# Patient Record
Sex: Male | Born: 1960 | Race: White | Hispanic: No | Smoking: Current every day smoker
Health system: Southern US, Community
[De-identification: ages and names within clinical notes are randomized; demographics above are authoritative.]

## PROBLEM LIST (undated history)

## (undated) DIAGNOSIS — J449 Chronic obstructive pulmonary disease, unspecified: Secondary | ICD-10-CM

## (undated) HISTORY — PX: HAND SURGERY: SHX662

---

## 2002-05-24 ENCOUNTER — Encounter: Payer: Self-pay | Admitting: Family Medicine

## 2002-05-24 ENCOUNTER — Ambulatory Visit (HOSPITAL_COMMUNITY): Admission: RE | Admit: 2002-05-24 | Discharge: 2002-05-24 | Payer: Self-pay | Admitting: Family Medicine

## 2004-05-16 ENCOUNTER — Ambulatory Visit (HOSPITAL_COMMUNITY): Admission: RE | Admit: 2004-05-16 | Discharge: 2004-05-16 | Payer: Self-pay | Admitting: Family Medicine

## 2006-09-24 ENCOUNTER — Inpatient Hospital Stay (HOSPITAL_COMMUNITY): Admission: EM | Admit: 2006-09-24 | Discharge: 2006-09-28 | Payer: Self-pay | Admitting: Emergency Medicine

## 2006-09-24 ENCOUNTER — Encounter: Payer: Self-pay | Admitting: Cardiology

## 2006-09-24 ENCOUNTER — Ambulatory Visit: Payer: Self-pay | Admitting: Cardiology

## 2006-10-11 ENCOUNTER — Ambulatory Visit: Payer: Self-pay | Admitting: Internal Medicine

## 2007-04-22 ENCOUNTER — Ambulatory Visit: Payer: Self-pay | Admitting: Internal Medicine

## 2008-05-28 IMAGING — CR DG CHEST 1V PORT
1 series · 1 of 1 positions shown · non-contrast
Comparison: None

CLINICAL DATA: Chest pain. Smoker. Weakness.

CHEST - 1 VIEW

[view not recorded]
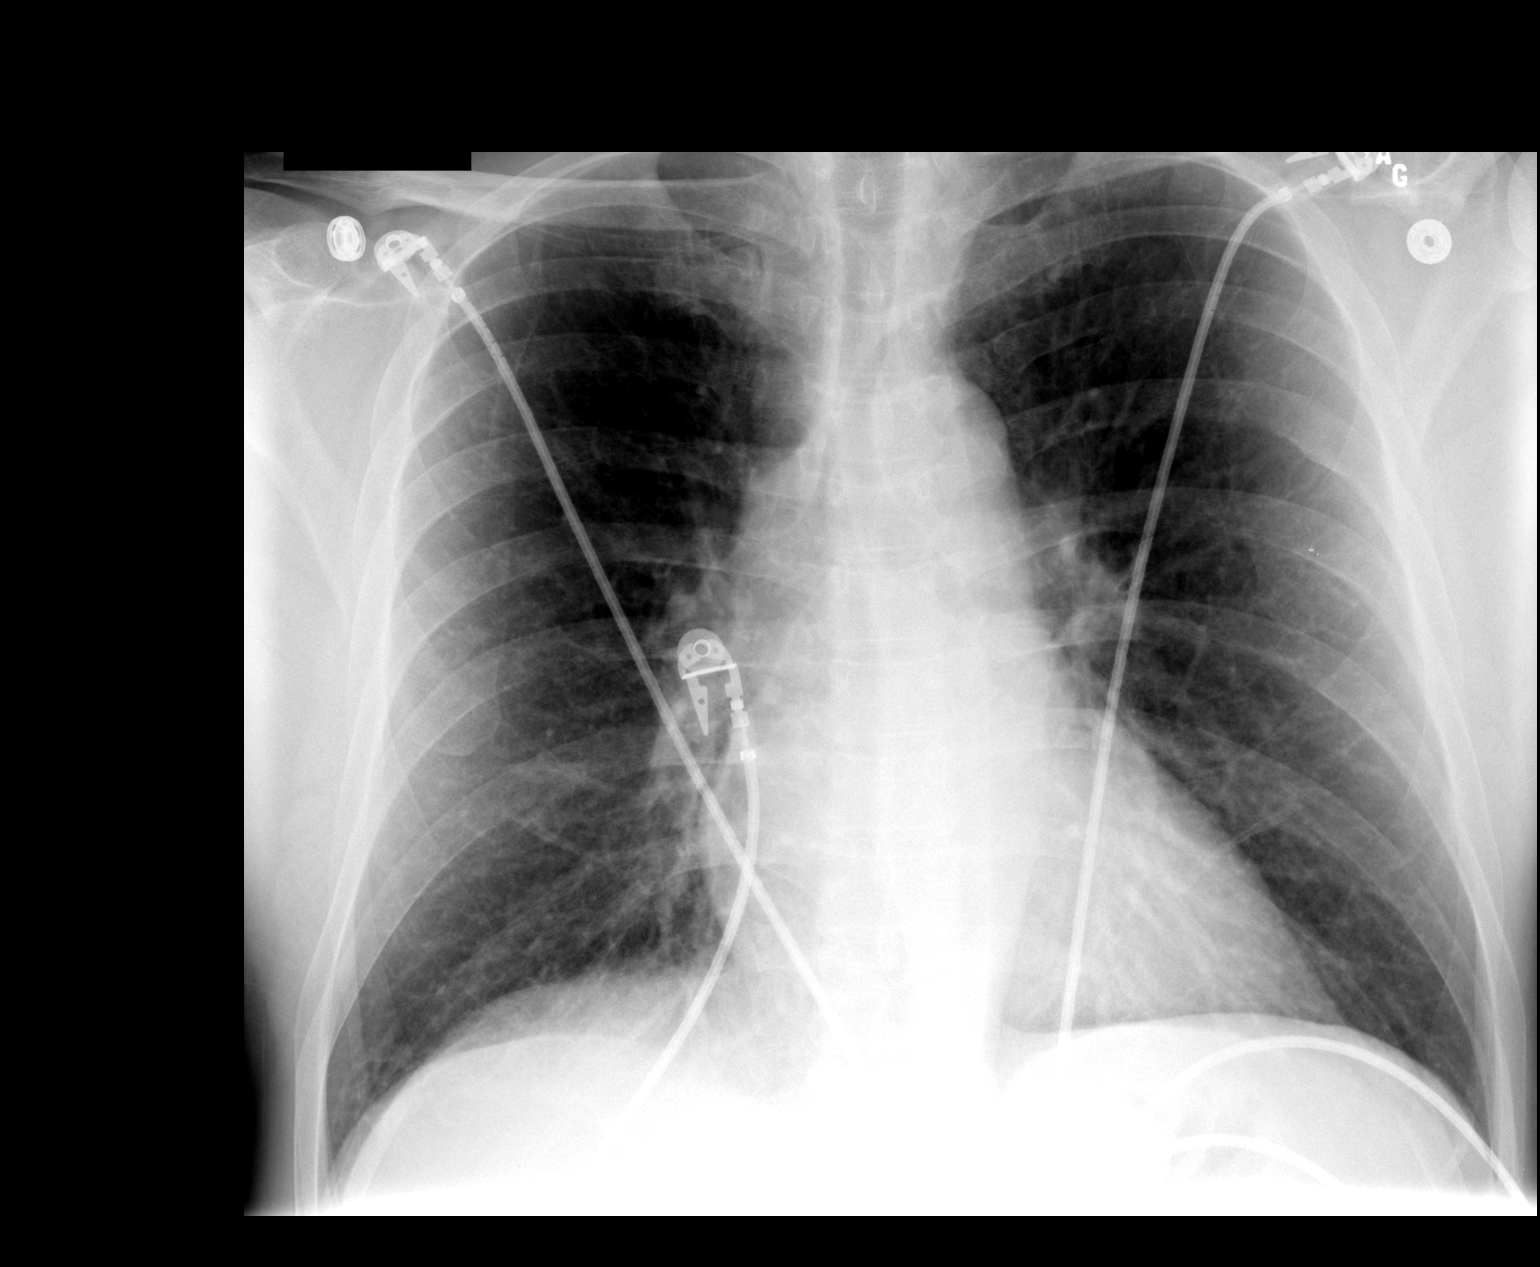

[1 of 1 positions shown; findings below may reference images not displayed]

FINDINGS: Midline trachea. Heart size upper limits of normal . Normal
mediastinum contours. Sharp costophrenic angles. No pneumothorax or congestive
failure. Mild peribronchial thickening. Clear lungs.

IMPRESSION

1. No acute cardiopulmonary disease.
2. Mild parabronchial thickening likely relates to the given history of smoking.

## 2009-01-05 ENCOUNTER — Emergency Department (HOSPITAL_COMMUNITY): Admission: EM | Admit: 2009-01-05 | Discharge: 2009-01-05 | Payer: Self-pay | Admitting: Emergency Medicine

## 2010-07-10 LAB — URINALYSIS, ROUTINE W REFLEX MICROSCOPIC
Glucose, UA: NEGATIVE mg/dL
Ketones, ur: 15 mg/dL — AB
Leukocytes, UA: NEGATIVE
Nitrite: NEGATIVE
Protein, ur: 100 mg/dL — AB
Specific Gravity, Urine: 1.025 (ref 1.005–1.030)
Urobilinogen, UA: 0.2 mg/dL (ref 0.0–1.0)
pH: 6 (ref 5.0–8.0)

## 2010-07-10 LAB — URINE MICROSCOPIC-ADD ON

## 2010-07-10 LAB — BASIC METABOLIC PANEL
Chloride: 90 mEq/L — ABNORMAL LOW (ref 96–112)
GFR calc Af Amer: >60 mL/min (ref >60)
Potassium: 3 mEq/L — ABNORMAL LOW (ref 3.5–5.1)

## 2010-07-10 LAB — CBC
HCT: 45.2 % (ref 39.0–52.0)
Hemoglobin: 15.9 g/dL (ref 13.0–17.0)
MCV: 89.2 fL (ref 78.0–100.0)
RBC: 5.07 MIL/uL (ref 4.22–5.81)
WBC: 15.2 10*3/uL — ABNORMAL HIGH (ref 4.0–10.5)

## 2010-07-10 LAB — CULTURE, BLOOD (ROUTINE X 2): Report Status: 10072010
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
## 2010-08-19 NOTE — Assessment & Plan Note (Signed)
[facility]                         ELECTROPHYSIOLOGY OFFICE NOTE   NAME:Shawn Fisher, Shawn Fisher                       MRN:          [id number]  DATE:10/11/2006                            DOB:          [birth year]    Shawn Fisher is referred today by Dr. Simona Huh for evaluation of tachy  palpitations and documented atrial tachycardia. The patient is  a very  pleasant middle-aged man who has a history of tachy palpitations and  documented SVT (atrial tach) resulting in admission to the hospital back  in June.  At that time, he was placed on beta blockers, underwent  catheterization demonstrating no obstructive coronary disease.  He did  have mild to moderate impressed LV function at 45%.  The patient states  that in the last several months, he has had frequent palpitations which  have worsened.  His lifestyle has been unusually difficult for him, in  that he has increased his tobacco use and increased his alcohol use,  also with decreased sleep.  He states that he has been under increasing  stress, as his granddaughter died suddenly at age 87, and he has been  working away from home and had difficulty sleeping.  Despite this, he  has had no syncope.  He denies peripheral edema.  He did have mild chest  discomfort.  After being hospitalized with severe chest pain and tachy  palpitations and SVT, he was treated with beta blockers resulting in  restoration of sinus rhythm.  Since discharge from the hospital, he has  been on beta blockers as well and has done with no recurrent tachy  palpitations and no documented SVT.  The patient states that his alcohol  consumption has decreased markedly, though he continues to smoke  approximately one pack of cigarettes per day.  He does have some  complaints of depression and is being evaluated by Dr. Gerda Diss for this.   MEDICATIONS:  Include aspirin, Toprol 100 mg daily, lisinopril 10 a day.  He had a prescription for Chantix  but has not had it filled.   FAMILY HISTORY:  Is noncontributory.   SOCIAL HISTORY:  As noted in the HPI.  Also has a history of marijuana  use.   REVIEW OF SYSTEMS:  Is as noted in the HPI, otherwise all systems were  reviewed and found to be negative.   PHYSICAL EXAMINATION:  GENERAL:  Pleasant, middle-aged man in no acute  distress.  VITAL SIGNS:  His blood pressure today was 148/90, the pulse 60 and  regular, the respirations were 18, the weight was 163 pounds.  HEENT:  Normocephalic, atraumatic.  Pupils equal and round.  Oropharynx  is moist, sclerae anicteric.  NECK:  Revealed no jugular venous distention.  There was no thyromegaly.  Trachea was midline.  The carotids were 2+ and symmetric.  LUNGS:  Clear bilaterally to auscultation.  No wheezes, rales or rhonchi  are present.  There is no increased work of breathing.  CARDIOVASCULAR:  Revealed a regular rate and rhythm and normal S1, S2.  There are no murmurs, rubs or gallops appreciated.  The PMI  was not enlarged nor was laterally displaced.  ABDOMEN:  Soft, nontender, nondistended.  There was no organomegaly.  The bowel sounds were present.  There is no rebound or guarding.  EXTREMITIES:  Demonstrated no cyanosis, clubbing or edema.  The pulses  are 2+ and symmetric.  NEUROLOGIC:  Alert and oriented x3.  Cranial nerves intact.  The  strength was 5/5 and symmetric.   The EKG demonstrates sinus rhythm with normal axis and intervals.  There  was an occasional PAC noted.   IMPRESSION:  1. Supraventricular tachycardia, likely secondary to atrial      tachycardia.  2. Mild to moderate LV dysfunction, likely secondary to ETOH abuse.  3. History of polysubstance abuse.   DISCUSSION:  I have recommended that Shawn Fisher stop abusing alcohol and  tobacco and recreational drugs.  I have recommended a period watchful  radium with regard to his arrhythmias.  If he has recurrent SVT, then I  would suggest that he considering  undergoing ablation.  Will plan to see  him back in the office in several months.     Doylene Canning. Ladona Ridgel, MD  Electronically Signed    GWT/MedQ  DD: 10/11/2006  DT: 10/11/2006  Job #: [id number]

## 2010-08-19 NOTE — Assessment & Plan Note (Signed)
[facility]                         ELECTROPHYSIOLOGY OFFICE NOTE   NAME:Shawn Fisher                       MRN:          [id number]  DATE:04/22/2007                            DOB:          [birth year]    Shawn Fisher returns today for follow-up.  He is a very pleasant 50-year-  old man who has a history of atrial tachycardia which was exacerbated by  some substance abuse including abuse of alcohol for which he was  hospitalized for back in the summertime.  Since then he has been stable.  He has quit drinking and he is no longer using illicit drugs.  He  returns today for follow-up. Since I saw him back in July, he has had  very little if any palpitations and denies shortness of breath.  He has  had no chest pain.   MEDICATIONS:  Toprol XL 100 a day which he has tolerating very nicely.   PHYSICAL EXAM:  Notable for him being a pleasant well-appearing young  man in no distress.  Blood pressure was 170/90, the pulse was 54 and regular, respirations  were 18.  Weight was 170 pounds.  NECK:  Revealed no jugular venous distention.  LUNGS:  Clear bilaterally to auscultation.  No wheezes, rales, or  rhonchi were present.  CARDIOVASCULAR:  Regular rate and rhythm with normal S1 and S2.  There  is a soft S4 gallop present.  EXTREMITY:  Demonstrated no edema.   IMPRESSION:  1. Symptomatic SVT now quiet.  2. Hypertension.   DISCUSSION:  Overall Shawn Fisher is stable, except that his blood  pressure remains somewhat elevated.  He continues on Toprol and he notes  that he did not take it this morning and I have asked that he go home  and take it. He may ultimately require additional antihypertensive  medicines and I have referred back to Dr. Gerda Diss for this who is his  primary physician in [place]. I will plan to see the patient back in  the office on a p.r.n. basis.     Doylene Canning. Ladona Ridgel, MD  Electronically Signed    GWT/MedQ  DD: 04/22/2007  DT:  04/22/2007  Job #: [id number]   cc:   Donna Bernard, M.D.

## 2010-08-19 NOTE — Discharge Summary (Signed)
Shawn Fisher, Shawn Fisher                ACCOUNT NO.:  [id number]   MEDICAL RECORD NO.:  [id number]          PATIENT TYPE:  INP   LOCATION:  3707                         FACILITY:  [facility]   PHYSICIAN:  Jonelle Sidle, MD DATE OF BIRTH:  09-11-1960   DATE OF ADMISSION:  09/24/2006  DATE OF DISCHARGE:  09/28/2006                               DISCHARGE SUMMARY   ALLERGIES:  No known drug allergies.   Greater than 35 minutes for this dictation.   FINAL DIAGNOSES:  Admitted with chest pain secondary to paroxysmal  ectopic atrial tachycardia.   SECONDARY DIAGNOSES:  1. Left heart catheterization September 27, 2006.  Coronaries were      angiographically normal, ejection fraction moderately diminished at      45%.  2. Possible tachycardia-mediated cardiomyopathy.  3. Troponin I studies negative x3.  4. Ongoing tobacco habituation, up to three packs per day.  5. Excessive ethanol use, greater than 1/2 of a fifth of alcohol a      day.  6. Recreational drug marijuana, increasing usage lately.  7. Depression, grieving over loss of granddaughter at age 50.   PROCEDURE:  September 27, 2006, left heart catheterization, Dr. Randa Evens. Study showed ejection fraction 45%. The left circumflex and  right are codominant. The coronary arteries showed no evidence of  angiographic lesions. Ejection fraction 45%. 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  He was  admitted after two episodes of crushing, gripping chest pain. He had  adjuvant symptoms which were dyspnea, especially with exertion.  He had  dizziness.  He had heart racing. The symptoms were abrupt in onset   The patient has been grieving for greater than a month for the loss of  his granddaughter at age 16 of a failing heart. He upped his ongoing  tobacco habituation to three packs per day. He is consuming consistently  greater than  1/2 of a  fifth of hard alcoholic beverage per night. He  started smoking marijuana once again. He credits greater than a month of  these behaviors with perhaps precipitating this chest pain   The patient also gives a history of about 10 years of noticing  palpitations.  They are not symptomatic as a crushing, gripping chest  pain or a dyspnea that affects his activities of daily living. At his  doctor's office, Dr. Fletcher Anon office, the electrocardiogram showed a  rapid heart beat with rates greater than 150s. The rhythm looks like an  ectopic atrial tachycardia, possibly a long PR supraventricular  tachycardia.  The patient will be admitted to [facility]. The  cardiac enzymes will be cycled.  He is scheduled for left heart  catheterization.  He will be placed on a beta-blocker.   HOSPITAL COURSE:  The patient admitted June 20 with crushing, gripping  chest pain. He has palpitations along with the chest discomfort.  Cardiac enzymes were negative x3. The patient ruled out for acute  myocardial infarction.  He was set up for coronary angiography  which was  done June 23.  This study shows codominant left circumflex and right  coronary arteries. No evidence of obstructive coronary artery disease in  the LAD, the left circumflex, or the right coronary artery. He does have  depressed ejection fraction of 45%.  This could be due to ethanol or  could be tachycardia mediated. The patient's tachycardia has become more  quiescent on beta blocker.  He is on Toprol XL 100 mg daily, and he  feels well.  He is anxious to go home and perhaps return as an  outpatient for radiofrequency catheter ablation after electrophysiology  study. Dr. Ladona Ridgel is to see the patient if possible before the patient  leaves today.   With regard to the grieving, the patient has been taking it out on  himself by smoking to much, drinking too much, and participating in  recreational drug use.  It is suggested a see  Dr. Gerda Diss for a  prescription for his depression which would help him without endangering  his overall health.   LABORATORY STUDIES PERTINENT TO THIS ADMISSION:  Complete blood count on  June 23:  Hemoglobin 14.8, hematocrit 43.9, platelets 197.  Sodium was  140, potassium 3.7, chloride 106, carbonate 28, BUN 10, creatinine 0.86,  glucose 98. Alkaline phosphatase this admission 124, SGOT 59, SGPT 80.  These studies are elevated. TSH 1.949. Hemoglobin A1c is 5.5.   The patient discharges on the following medications:  1. Enteric-coated aspirin 81 mg daily.  2. Toprol XL 100 mg daily.  3. Lisinopril 10 mg daily.  4. Chantix as directed.  5. Multivitamin daily.   Shawn Fisher has an office visit at [facility], [address], on July 21 at 2:45.  This will be a post catheterization visit.  The patient will probably return for radiofrequency catheter ablation of  his long RP tachycardia. This will be addressed in a separate dictation.      Maple Mirza, [place]      Jonelle Sidle, MD  Electronically Signed    GM/MEDQ  D:  09/28/2006  T:  09/28/2006  Job:  [id number]   cc:   Donna Bernard, M.D.  Salvadore Farber, MD  Doylene Canning. Ladona Ridgel, MD

## 2010-08-19 NOTE — Cardiovascular Report (Signed)
NAMEKWAKU, MOSTAFA                ACCOUNT NO.:  [id number]   MEDICAL RECORD NO.:  [id number]          PATIENT TYPE:  INP   LOCATION:  3707                         FACILITY:  [facility]   PHYSICIAN:  Salvadore Farber, MD  DATE OF BIRTH:  02-Jan-1961   DATE OF PROCEDURE:  09/27/2006  DATE OF DISCHARGE:                            CARDIAC CATHETERIZATION   PROCEDURES:  Left heart catheterization, left ventriculography, coronary  angiography.   INDICATIONS:  Mr. Zweber is a 50 year old gentleman with heavy alcohol  use and tobacco abuse who presents with dyspnea.  He is found to be an  ectopic atrial tachycardia. Rate has been slowed with the initiation of  beta-blocker.  Echocardiogram suggested EF of 40-45% with global  hypokinesis.  He was referred for diagnostic angiography to exclude  coronary disease as the contributor to his cardiomyopathy.   PROCEDURAL TECHNIQUE:  Informed consent was obtained.  Under 1%  lidocaine with local anesthesia, a 5-French sheath was placed in the  right common femoral artery using the modified Seldinger technique.  Diagnostic angiography and ventriculography were performed using JL-4,  JR-4 and pigtail catheters.  The patient tolerated the procedure well  and was transferred to the holding room in stable condition.  The sheath  will be removed there.   COMPLICATIONS:  None.   FINDINGS:  1. LV:  135/12/16.  EF 45% without regional wall motion abnormality.      There is mild diffuse hypokinesis.  2. No aortic stenosis or mitral regurgitation.  3. Left main:  Angiographically normal.  4. LAD:  Moderate-sized vessel giving rise to two diagonals.  It is      angiographically normal.  5. Circumflex:  Moderate-sized codominant vessel giving rise to a      single obtuse marginal and a PDA.  It is angiographically normal.      6.  RCA:  Moderate-sized codominant vessel.  It is angiographically      normal.   IMPRESSION/RECOMMENDATIONS:  1. Angiographically  normal coronary arteries.  2. Mildly to moderately impaired left ventricular systolic      dysfunction.  This is likely due either to his tachycardia or his      alcohol use.  Recommend cessation of alcohol and continuation of      beta-blocker for control of the rate of      his ectopic atrial rhythm.  3. Increase beta-blocker.  4. Add angiotensin-converting enzyme inhibitor.      Salvadore Farber, MD  Electronically Signed     WED/MEDQ  D:  09/27/2006  T:  09/27/2006  Job:  [id number]   cc:   Donna Bernard, M.D.  Noralyn Pick. Eden Emms, MD, [facility]

## 2011-01-21 LAB — DIFFERENTIAL
Basophils Absolute: 0.1
Eosinophils Relative: 1
Lymphocytes Relative: 24
Monocytes Absolute: 1.3 — ABNORMAL HIGH
Monocytes Relative: 10

## 2011-01-21 LAB — COMPREHENSIVE METABOLIC PANEL
AST: 59 — ABNORMAL HIGH
Albumin: 4
Chloride: 106
Creatinine, Ser: 0.76
GFR calc Af Amer: >60
Potassium: 3.7
Total Bilirubin: 1

## 2011-01-21 LAB — BASIC METABOLIC PANEL
BUN: 10
CO2: 26
CO2: 28
Chloride: 106
Chloride: 108
Glucose, Bld: 93
Glucose, Bld: 98
Potassium: 3.7
Potassium: 3.7
Sodium: 141

## 2011-01-21 LAB — CBC
HCT: 43.9
HCT: 43.9
HCT: 45.1
HCT: 45.3
HCT: 50.5
Hemoglobin: 14.8
Hemoglobin: 15.3
Hemoglobin: 15.4
Hemoglobin: 17
MCHC: 33.8
MCHC: 34
MCV: 88.8
MCV: 89.6
MCV: 89.8
Platelets: 197
Platelets: 216
RBC: 5.04
RBC: 5.05
RDW: 13.8
RDW: 13.9
RDW: 13.9
WBC: 12 — ABNORMAL HIGH
WBC: 8.4
WBC: 8.8

## 2011-01-21 LAB — TROPONIN I
Troponin I: 0.01
Troponin I: 0.02

## 2011-01-21 LAB — CK TOTAL AND CKMB (NOT AT ARMC)
Relative Index: 1.8
Total CK: 71

## 2011-01-21 LAB — TSH: TSH: 1.949

## 2011-01-21 LAB — HEMOGLOBIN A1C: Hgb A1c MFr Bld: 5.5

## 2011-01-21 LAB — B-NATRIURETIC PEPTIDE (CONVERTED LAB): Pro B Natriuretic peptide (BNP): 54

## 2011-01-21 LAB — APTT: aPTT: 28

## 2014-04-30 ENCOUNTER — Encounter: Payer: Self-pay | Admitting: Family Medicine

## 2014-04-30 ENCOUNTER — Ambulatory Visit (INDEPENDENT_AMBULATORY_CARE_PROVIDER_SITE_OTHER): Payer: PRIVATE HEALTH INSURANCE | Admitting: Family Medicine

## 2014-04-30 VITALS — BP 152/98 | Temp 98.6°F | Ht 70.0 in | Wt 166.0 lb

## 2014-04-30 DIAGNOSIS — J329 Chronic sinusitis, unspecified: Secondary | ICD-10-CM

## 2014-04-30 MED ORDER — BENZONATATE 100 MG PO CAPS: 100.0000 mg | ORAL_CAPSULE | Freq: Three times a day (TID) | ORAL | Status: AC | PRN

## 2014-04-30 MED ORDER — AMOXICILLIN-POT CLAVULANATE 875-125 MG PO TABS: 1.0000 | ORAL_TABLET | Freq: Two times a day (BID) | ORAL | Status: AC

## 2014-04-30 NOTE — Progress Notes (Addendum)
   Subjective:    Patient ID: Ruel FavorsJerry W Krell, male    DOB: 1960-08-14, 54 y.o.   MRN: [id number]  HPI Comments: Pt has not been seen here since 2010. Alcario Droughtrica said that she was sorry, she did not know he hasn't been seen here in 5+ years.   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 He said he is afraid of doctors and needles.   Cough This is a new problem. Episode onset: 3 weeks ago. The problem has been gradually worsening. The cough is productive of sputum. Associated symptoms include nasal congestion, postnasal drip, rhinorrhea and a sore throat. The symptoms are aggravated by lying down. He has tried OTC cough suppressant for the symptoms. The treatment provided mild relief.  coughing up phlegm  Does smoke  Sig frontal headache and dim energy      Review of Systems  HENT: Positive for postnasal drip, rhinorrhea and sore throat.   Respiratory: Positive for cough.        Objective:   Physical Exam Alert mild malaise good hydration HEENT moderate nasal congestion. Frontal tenderness. Pharynx normal lungs bronchial cough no wheezes no crackles no tachypnea heart regular rate and rhythm       Assessment & Plan:  Impression subacute rhinosinusitis/bronchitis plan antibiotics prescribed. Symptomatic care discussed. Encouraged to stop smoking . Of note not seen for 5 years. Strongly encouraged to come in regular for preventive exams and interventions.WSL

## 2017-09-01 ENCOUNTER — Telehealth: Payer: Self-pay | Admitting: *Deleted

## 2017-09-01 NOTE — Telephone Encounter (Signed)
Patient's daughter called and stated that the patient has noticed blood in his stool this week and has also been experiencing shortness of breath for a while. Consult with Dr Lorin Picket who advises patient to go to ER for evaluation and treatment. Daughter verbalized understanding.

## 2017-09-24 ENCOUNTER — Encounter: Payer: Self-pay | Admitting: Family Medicine

## 2017-09-24 ENCOUNTER — Ambulatory Visit: Payer: 59 | Admitting: Family Medicine

## 2017-09-24 ENCOUNTER — Ambulatory Visit (HOSPITAL_COMMUNITY)
Admission: RE | Admit: 2017-09-24 | Discharge: 2017-09-24 | Disposition: A | Discharge status: Home / Self Care | Payer: 59 | Source: Ambulatory Visit | Attending: Family Medicine | Admitting: Family Medicine

## 2017-09-24 VITALS — BP 128/82 | Ht 70.0 in | Wt 159.2 lb

## 2017-09-24 DIAGNOSIS — J984 Other disorders of lung: Secondary | ICD-10-CM | POA: Insufficient documentation

## 2017-09-24 DIAGNOSIS — R0600 Dyspnea, unspecified: Secondary | ICD-10-CM

## 2017-09-24 DIAGNOSIS — F17200 Nicotine dependence, unspecified, uncomplicated: Secondary | ICD-10-CM | POA: Insufficient documentation

## 2017-09-24 DIAGNOSIS — K921 Melena: Secondary | ICD-10-CM

## 2017-09-24 MED ORDER — BUPROPION HCL ER (SR) 150 MG PO TB12: 150.0000 mg | ORAL_TABLET | Freq: Two times a day (BID) | ORAL | 3 refills | Status: DC

## 2017-09-24 MED ORDER — ALBUTEROL SULFATE HFA 108 (90 BASE) MCG/ACT IN AERS: 2.0000 | INHALATION_SPRAY | Freq: Four times a day (QID) | RESPIRATORY_TRACT | 0 refills | Status: DC | PRN

## 2017-09-24 NOTE — Progress Notes (Signed)
   Subjective:    Patient ID: Shawn Fisher, male    DOB: Oct 03, 1960, 57 y.o.   MRN: [id number]  HPI  Patietn arrives to discuss his options to quit smoking.  Pt notes some progressive shortness of breath  Used to smoke three ppd ,   Works for [employer]  Does not exrcise,  Smoked at age 57 , no quittinng   notrs fair amnt of wheezing   Coughing up stuff most morning    Saw some red in stools few weeks ago, and puts it off as probable from tomato juice   No fam hx of colon cancer    No fam hx of copd or emphysema, Both parent s smoked   Testing testing.  Of note patient presents after a multiyear hiatus.  Has not seen a doctor at all.  Unfortunately smokes a lot.  No hemoptysis.  Definitely morning cough productive in nature  Did see a little bit of blood in stool.  No major change in bowel habits no weight loss.  Would like to quit smoking.    Review of Systems No headache, no major weight loss or weight gain, no chest pain no back pain abdominal pain no change in bowel habits complete ROS otherwise negative     Objective:   Physical Exam  Alert and oriented, vitals reviewed and stable, NAD ENT-TM's and ext canals WNL bilat via otoscopic exam Soft palate, tonsils and post pharynx WNL via oropharyngeal exam Neck-symmetric, no masses; thyroid nonpalpable and nontender Pulmonary-lungs diminished breath sounds diffusely.  No tachypnea or accessory muscle use; Clear without wheezes via auscultation Card--no abnrml murmurs, rhythm reg and rate WNL Carotid pulses symmetric, without bruits       Assessment & Plan:  1 impression probable COPD.  Discussed at length.  Chest x-ray.  Pulmonary function test.  Initiate albuterol.  Proper use discussed prescribed.  Smoking cessation discussed.  Options discussed.  Patient shows Wellbutrin.  2.  Blood per stools.  Discussed.  Concerning with no hx of colonoscopy. Time for colonoscopy patient in agreement this will be  initiated.  f u post xry and pft's

## 2017-10-04 ENCOUNTER — Encounter: Payer: Self-pay | Admitting: Family Medicine

## 2017-10-12 ENCOUNTER — Ambulatory Visit (HOSPITAL_COMMUNITY)
Admission: RE | Admit: 2017-10-12 | Discharge: 2017-10-12 | Disposition: A | Discharge status: Home / Self Care | Payer: 59 | Source: Ambulatory Visit | Attending: Family Medicine | Admitting: Family Medicine

## 2017-10-12 DIAGNOSIS — F17200 Nicotine dependence, unspecified, uncomplicated: Secondary | ICD-10-CM | POA: Diagnosis present

## 2017-10-12 DIAGNOSIS — R0600 Dyspnea, unspecified: Secondary | ICD-10-CM | POA: Insufficient documentation

## 2017-10-12 LAB — PULMONARY FUNCTION TEST
DL/VA % PRED: 57 %
DL/VA: 2.66 ml/min/mmHg/L
DLCO unc % pred: 45 %
DLCO unc: 14.84 ml/min/mmHg
FEF 25-75 Post: 1.37 L/sec
FEF 25-75 Pre: 0.77 L/sec
FEF2575-%Change-Post: 78 %
FEF2575-%Pred-Post: 45 %
FEF2575-%Pred-Pre: 25 %
FEV1-%Change-Post: 39 %
FEV1-%PRED-PRE: 41 %
FEV1-%Pred-Post: 58 %
FEV1-POST: 2.13 L
FEV1-PRE: 1.52 L
FEV1FVC-%Change-Post: 26 %
FEV1FVC-%Pred-Pre: 57 %
FEV6-%Change-Post: 11 %
FEV6-%PRED-POST: 78 %
FEV6-%PRED-PRE: 70 %
FEV6-POST: 3.59 L
FEV6-Pre: 3.23 L
FEV6FVC-%Change-Post: 0 %
FEV6FVC-%PRED-POST: 98 %
FEV6FVC-%PRED-PRE: 97 %
FVC-%Change-Post: 10 %
FVC-%Pred-Post: 79 %
FVC-%Pred-Pre: 72 %
FVC-POST: 3.84 L
FVC-Pre: 3.49 L
POST FEV6/FVC RATIO: 93 %
PRE FEV6/FVC RATIO: 93 %
Post FEV1/FVC ratio: 55 %
Pre FEV1/FVC ratio: 44 %
RV % PRED: 199 %
RV: 4.43 L
TLC % PRED: 111 %
TLC: 7.83 L

## 2017-10-12 MED ORDER — ALBUTEROL SULFATE (2.5 MG/3ML) 0.083% IN NEBU
2.5000 mg | INHALATION_SOLUTION | Freq: Once | RESPIRATORY_TRACT | Status: AC
  Administered 2017-10-12: 2.5 mg via RESPIRATORY_TRACT

## 2017-10-15 ENCOUNTER — Encounter: Payer: Self-pay | Admitting: Family Medicine

## 2017-10-15 ENCOUNTER — Ambulatory Visit: Payer: 59 | Admitting: Family Medicine

## 2017-10-15 DIAGNOSIS — J449 Chronic obstructive pulmonary disease, unspecified: Secondary | ICD-10-CM | POA: Diagnosis not present

## 2017-10-15 MED ORDER — BUPROPION HCL ER (SR) 150 MG PO TB12: 150.0000 mg | ORAL_TABLET | Freq: Two times a day (BID) | ORAL | 5 refills | Status: DC

## 2017-10-15 MED ORDER — FLUTICASONE-SALMETEROL 115-21 MCG/ACT IN AERO: INHALATION_SPRAY | RESPIRATORY_TRACT | 5 refills | Status: DC

## 2017-10-15 NOTE — Progress Notes (Signed)
   Subjective:    Patient ID: Shawn FavorsJerry W Chapa, male    DOB: 1961/02/21, 57 y.o.   MRN: [id number]  HPI Pt here today to discuss PFT that he had done on 10/12/17.  Patient notes long-standing smoking history.  2 to 3 packs/day for decades.  Her graph progressive shortness of breath and wheezing years.  Diminished exercise capacity.  Occasional cough.  Generally nonproductive.  No hemoptysis.  No chest pain.  She is notes substantial worsening dyspnea with exertion     Review of Systems No headache, no major weight loss or weight gain, no chest pain no back pain abdominal pain no change in bowel habits complete ROS otherwise negative     Objective:   Physical Exam Alert and oriented, vitals reviewed and stable, NAD ENT-TM's and ext canals WNL bilat via otoscopic exam Soft palate, tonsils and post pharynx WNL via oropharyngeal exam Neck-symmetric, no masses; thyroid nonpalpable and nontender Pulmonary-no tachypnea or accessory muscle use; Clear without wheezes via auscultation Card--no abnrml murmurs, rhythm reg and rate WNL Carotid pulses symmetric, without bruits  Pulmonary function test.  See results.  Clear challenge with substantially diminished FEV.  Of note 40% improvement after nebulizer treatment which reveals a reversible component.       Assessment & Plan:  1 impression COPD.  There were some aspects of the PFTs which were not perfectly classic.  However with the overall decline in FVC and FEV along with the reversible element along with patient's massive smoking history.  High likelihood that this is COPD with reversible discussed at great length.  Multiple questions answered.  Patient working on smoking cessation  Will initiate Advair 2 puffs twice daily rationale discussed.  Utilize albuterol as needed.  Follow-up for wellness as scheduled  Greater than 50% of this 25 minute face to face visit was spent in counseling and discussion and coordination of care regarding  the above diagnosis/diagnosies

## 2017-10-15 NOTE — Patient Instructions (Signed)

## 2018-01-07 ENCOUNTER — Encounter: Payer: 59 | Admitting: Family Medicine

## 2018-02-28 ENCOUNTER — Encounter (HOSPITAL_COMMUNITY): Payer: Self-pay | Admitting: Emergency Medicine

## 2018-02-28 ENCOUNTER — Other Ambulatory Visit: Payer: Self-pay

## 2018-02-28 ENCOUNTER — Emergency Department (HOSPITAL_COMMUNITY)
Admission: EM | Admit: 2018-02-28 | Discharge: 2018-02-28 | Disposition: A | Discharge status: Home / Self Care | Payer: PRIVATE HEALTH INSURANCE | Attending: Emergency Medicine | Admitting: Emergency Medicine

## 2018-02-28 ENCOUNTER — Emergency Department (HOSPITAL_COMMUNITY): Payer: PRIVATE HEALTH INSURANCE

## 2018-02-28 DIAGNOSIS — F17200 Nicotine dependence, unspecified, uncomplicated: Secondary | ICD-10-CM | POA: Diagnosis not present

## 2018-02-28 DIAGNOSIS — Z79899 Other long term (current) drug therapy: Secondary | ICD-10-CM | POA: Diagnosis not present

## 2018-02-28 DIAGNOSIS — W010XXA Fall on same level from slipping, tripping and stumbling without subsequent striking against object, initial encounter: Secondary | ICD-10-CM | POA: Diagnosis not present

## 2018-02-28 DIAGNOSIS — Y9289 Other specified places as the place of occurrence of the external cause: Secondary | ICD-10-CM | POA: Insufficient documentation

## 2018-02-28 DIAGNOSIS — Y990 Civilian activity done for income or pay: Secondary | ICD-10-CM | POA: Diagnosis not present

## 2018-02-28 DIAGNOSIS — S62024A Nondisplaced fracture of middle third of navicular [scaphoid] bone of right wrist, initial encounter for closed fracture: Secondary | ICD-10-CM | POA: Diagnosis not present

## 2018-02-28 DIAGNOSIS — Y939 Activity, unspecified: Secondary | ICD-10-CM | POA: Diagnosis not present

## 2018-02-28 DIAGNOSIS — J449 Chronic obstructive pulmonary disease, unspecified: Secondary | ICD-10-CM | POA: Diagnosis not present

## 2018-02-28 DIAGNOSIS — S6991XA Unspecified injury of right wrist, hand and finger(s), initial encounter: Secondary | ICD-10-CM | POA: Diagnosis present

## 2018-02-28 HISTORY — DX: Chronic obstructive pulmonary disease, unspecified: J44.9

## 2018-02-28 MED ORDER — NAPROXEN 250 MG PO TABS
500.0000 mg | ORAL_TABLET | Freq: Once | ORAL | Status: AC
  Administered 2018-02-28: 500 mg via ORAL
  Filled 2018-02-28: qty 2

## 2018-02-28 MED ORDER — NAPROXEN 500 MG PO TABS: 500.0000 mg | ORAL_TABLET | Freq: Two times a day (BID) | ORAL | 0 refills | Status: DC

## 2018-02-28 MED ORDER — HYDROCODONE-ACETAMINOPHEN 5-325 MG PO TABS: 1.0000 | ORAL_TABLET | ORAL | 0 refills | Status: DC | PRN

## 2018-02-28 NOTE — ED Triage Notes (Signed)
Pt reports slipping and falling onto RT wrist. C/o RT wrist pain. No obvious deformity noted.

## 2018-02-28 NOTE — ED Notes (Signed)
PA aware of bp 

## 2018-02-28 NOTE — ED Provider Notes (Addendum)
[facility] EMERGENCY DEPARTMENT Provider Note   CSN: [id number] Arrival date & time: 02/28/18  1444     History   Chief Complaint Chief Complaint  Patient presents with  . Wrist Injury    HPI Shawn Fisher is a 57 y.o. male with a history of COPD and history of left hand including scaphoid fracture requiring surgical intervention approximately 5 years ago presenting with pain and swelling of his right wrist after tripping and falling at work this morning.  He initially had mild to moderate pain, took 2 Aleve and continued to work his day.  However as the days progressed he has had increasing severe throbbing pain at his radial wrist.  He denies weakness or numbness distal to the injury site and there is no radiation to pain.  He has had no other treatments prior to arrival.  Patient is right-handed.  HPI  Past Medical History:  Diagnosis Date  . COPD (chronic obstructive pulmonary disease) [facility])     Patient Active Problem List   Diagnosis Date Noted  . COPD mixed type (HCC) 10/15/2017    Past Surgical History:  Procedure Laterality Date  . HAND SURGERY Left         Home Medications    Prior to Admission medications   Medication Sig Start Date End Date Taking? Authorizing Provider  albuterol (PROVENTIL HFA;VENTOLIN HFA) 108 (90 Base) MCG/ACT inhaler Inhale 2 puffs into the lungs every 6 (six) hours as needed for wheezing or shortness of breath. 09/24/17   Merlyn Albert, MD  buPROPion (WELLBUTRIN SR) 150 MG 12 hr tablet Take 1 tablet (150 mg total) by mouth 2 (two) times daily. 10/15/17   Merlyn Albert, MD  fluticasone-salmeterol (ADVAIR HFA) [phone] MCG/ACT inhaler Inhale 2 puffs twice a day 10/15/17   Merlyn Albert, MD  HYDROcodone-acetaminophen (NORCO/VICODIN) 5-325 MG tablet Take 1 tablet by mouth every 4 (four) hours as needed. 02/28/18   Burgess Amor, PA-C  naproxen (NAPROSYN) 500 MG tablet Take 1 tablet (500 mg total) by mouth 2 (two) times daily.  02/28/18   Burgess Amor, PA-C    Family History No family history on file.  Social History Social History   Tobacco Use  . Smoking status: Current Every Day Smoker  . Smokeless tobacco: Never Used  Substance Use Topics  . Alcohol use: Not on file  . Drug use: Not on file     Allergies   Patient has no known allergies.   Review of Systems Review of Systems  Constitutional: Negative for fever.  Musculoskeletal: Positive for arthralgias. Negative for joint swelling and myalgias.  Neurological: Negative for weakness and numbness.     Physical Exam Updated Vital Signs BP 117/63 (BP Location: Left Arm)   Pulse 74   Temp 98.3 F (36.8 C) (Oral)   Resp 16   Ht 5\' 10"  (1.778 m)   Wt 77.1 kg   SpO2 98%   BMI 24.39 kg/m   Physical Exam  Constitutional: He appears well-developed and well-nourished.  HENT:  Head: Atraumatic.  Neck: Normal range of motion.  Cardiovascular:  Pulses equal bilaterally  Musculoskeletal: He exhibits tenderness.       Right wrist: He exhibits decreased range of motion and bony tenderness. He exhibits no swelling, no effusion, no crepitus and no deformity.  Tender to palpation along the patient's radial wrist including the scaphoid region.  There is no appreciable edema or erythema and no deformity.  Distal sensation is intact.  He  has less than 2-second cap refill in his fingertips.  Radial pulse is 2+.  Neurological: He is alert. He has normal strength. He displays normal reflexes. No sensory deficit.  Skin: Skin is warm and dry.  Psychiatric: He has a normal mood and affect.     ED Treatments / Results  Labs (all labs ordered are listed, but only abnormal results are displayed) Labs Reviewed - No data to display  EKG None  Radiology Dg Wrist Complete Right  Result Date: 02/28/2018 CLINICAL DATA:  Fall with right wrist pain EXAM: RIGHT WRIST - COMPLETE 3+ VIEW COMPARISON:  None. FINDINGS: Trabecular distortion and lateral cortex  irregularity of the scaphoid waist, likely a nondisplaced fracture. Normal wrist alignment. IMPRESSION: Suspect a nondisplaced scaphoid waist fracture. Electronically Signed   By: Marnee Spring M.D.   On: 02/28/2018 15:23    Procedures Procedures (including critical care time)  SPLINT APPLICATION Date/Time: 4:50 PM Authorized by: Burgess Amor Consent: Verbal consent obtained. Risks and benefits: risks, benefits and alternatives were discussed Consent given by: patient Splint applied by: RN Location details: right wrist Splint type: thumb spica Supplies used: fiberglass, webril, ace wraps. Sling provided Post-procedure: The splinted body part was neurovascularly unchanged following the procedure. Patient tolerance: Patient tolerated the procedure well with no immediate complications.     Medications Ordered in ED Medications  naproxen (NAPROSYN) tablet 500 mg (has no administration in time range)     Initial Impression / Assessment and Plan / ED Course  I have reviewed the triage vital signs and the nursing notes.  Pertinent labs & imaging results that were available during my care of the patient were reviewed by me and considered in my medical decision making (see chart for details).     Images reviewed and discussed with patient.  He will be placed in a thumb spica splint, discussed ice and elevation.  Patient referred to Dr. Mina Marble for further care of this injury.  Patient knows he was seen by this clinic for his other injury but cannot recall the name of his hand surgeon.  Final Clinical Impressions(s) / ED Diagnoses   Final diagnoses:  Closed nondisplaced fracture of middle third of scaphoid bone of right wrist, initial encounter    ED Discharge Orders         Ordered    naproxen (NAPROSYN) 500 MG tablet  2 times daily     02/28/18 1648    HYDROcodone-acetaminophen (NORCO/VICODIN) 5-325 MG tablet  Every 4 hours PRN     02/28/18 1648           Stephenie Navejas, Raynelle Fanning,  PA-C 02/28/18 1650  At time of discharge repeat set of vitals obtained and his blood pressure is now 203/102.  His blood pressure at time of triage was 117/63.  He denies headache, chest pain, shortness of breath.  He does endorse having episodic elevated blood pressures when in his PCPs office as well, but then allowed to relax and at recheck his blood pressure is always been improved.  He did have a lot of pain during the placement of his splint and I suspect this may be a pain reaction.  He is driving himself home so I cannot give him a narcotic pain reliever here but have prescribed some for home use.  Advised patient he should have a recheck of his blood pressure this week and I suspect that this will be measured when he visits Dr. Mina Marble.  PRN follow-up anticipated.   Burgess Amor, PA-C  02/28/18 1727    Samuel JesterMcManus, Kathleen, DO 03/03/18 1846

## 2018-02-28 NOTE — Discharge Instructions (Signed)
Appears splint clean and dry.  Elevating your hand above heart level will help with pain and swelling.  You may use the hydrocodone if needed for extra pain relief if the Naprosyn is not strong enough.  Do not drive within 4 hours of taking hydrocodone as this medication will make you drowsy.  Call Dr. Mina MarbleWeingold for an office visit for further management of your injury.

## 2018-04-29 ENCOUNTER — Encounter: Payer: Self-pay | Admitting: Family Medicine

## 2018-04-29 ENCOUNTER — Ambulatory Visit: Payer: 59 | Admitting: Family Medicine

## 2018-04-29 VITALS — Ht 70.0 in | Wt 167.0 lb

## 2018-04-29 DIAGNOSIS — Z79899 Other long term (current) drug therapy: Secondary | ICD-10-CM | POA: Diagnosis not present

## 2018-04-29 DIAGNOSIS — Z125 Encounter for screening for malignant neoplasm of prostate: Secondary | ICD-10-CM | POA: Diagnosis not present

## 2018-04-29 DIAGNOSIS — Z13220 Encounter for screening for lipoid disorders: Secondary | ICD-10-CM

## 2018-04-29 DIAGNOSIS — J449 Chronic obstructive pulmonary disease, unspecified: Secondary | ICD-10-CM

## 2018-04-29 DIAGNOSIS — F17200 Nicotine dependence, unspecified, uncomplicated: Secondary | ICD-10-CM

## 2018-04-29 DIAGNOSIS — I10 Essential (primary) hypertension: Secondary | ICD-10-CM

## 2018-04-29 MED ORDER — AMLODIPINE BESYLATE 5 MG PO TABS: ORAL_TABLET | ORAL | 1 refills | Status: DC

## 2018-04-29 NOTE — Patient Instructions (Signed)

## 2018-04-29 NOTE — Progress Notes (Signed)
   Subjective:    Patient ID: Shawn Fisher, male    DOB: 1960/11/30, 58 y.o.   MRN: [id number] Patient arrives office for numerous concerns. HPI Patient is here today with concerns over blood pressure being elevated.He states he has never been diagnosed with Hypertension in the past. So He is not currently on any anti hypertension agent, he is still smoking a pack of cigarettes per day,he says this is a decrease from the two packs per Day he was smoking.  He has been seeing a hand specialist Dr.Weingold and they have checked his bp while there and it has been elevated they suggested that he call our office and arrange an appointment to discuss.  Patient claims compliance with the Advair.  Overall helping his breathing but still having wheezing at times.  Still short of breath at times.  Unfortunately patient continues to smoke.  Down to 1 pack/day.  He reports he did not like the Wellbutrin so he stopped it.  Patient also wants to discuss other potential interventions for smoking cessation  Orthostatic bp's done in the office today.  Pos  fam hx of high bl pressure   Mom has    Down to one pk per day    Review of Systems No headache, no major weight loss or weight gain, no chest pain no back pain abdominal pain no change in bowel habits complete ROS otherwise negative     Objective:   Physical Exam  Alert and oriented, vitals reviewed and stable, NAD ENT-TM's and ext canals WNL bilat via otoscopic exam Soft palate, tonsils and post pharynx WNL via oropharyngeal exam Neck-symmetric, no masses; thyroid nonpalpable and nontender Pulmonary-no tachypnea or accessory muscle use; Clear without wheezes via auscultation, however breath sounds diffusely diminished Card--no abnrml murmurs, rhythm reg and rate WNL Carotid pulses symmetric, without bruits       Assessment & Plan:  Impression 1 COPD.  Substantial.  Ongoing symptomatology.  Unfortunately ongoing smoking.  Maintain  Advair.  2 smoking cessation.  Discussed at length.  Does not want Chantix.  Wellbutrin did not help.  Various nicotine described.  3.  Hypertension.  New onset.  Blood pressures reviewed.  Time to initiate medicine particularly with multiple risk factors.  Discussed.  Appropriate blood work.  Patient to try nicotine cessation techniques.  Initiate Norvasc.  Side effects benefits discussed  Greater than 50% of this 25 minute face to face visit was spent in counseling and discussion and coordination of care regarding the above diagnosis/diagnosies

## 2018-05-01 DIAGNOSIS — I10 Essential (primary) hypertension: Secondary | ICD-10-CM | POA: Insufficient documentation

## 2018-05-01 NOTE — Addendum Note (Signed)
Addended by: Merlyn Albert on: 05/01/2018 10:33 AM   Modules accepted: Orders

## 2018-07-29 ENCOUNTER — Ambulatory Visit: Payer: 59 | Admitting: Family Medicine

## 2019-03-06 ENCOUNTER — Ambulatory Visit (INDEPENDENT_AMBULATORY_CARE_PROVIDER_SITE_OTHER): Payer: 59 | Admitting: Family Medicine

## 2019-03-06 ENCOUNTER — Other Ambulatory Visit: Payer: Self-pay

## 2019-03-06 DIAGNOSIS — J449 Chronic obstructive pulmonary disease, unspecified: Secondary | ICD-10-CM

## 2019-03-06 DIAGNOSIS — Z20828 Contact with and (suspected) exposure to other viral communicable diseases: Secondary | ICD-10-CM | POA: Diagnosis not present

## 2019-03-06 DIAGNOSIS — Z20822 Contact with and (suspected) exposure to covid-19: Secondary | ICD-10-CM

## 2019-03-06 MED ORDER — ADVAIR HFA 115-21 MCG/ACT IN AERO: INHALATION_SPRAY | RESPIRATORY_TRACT | 5 refills | Status: DC

## 2019-03-06 MED ORDER — PREDNISONE 20 MG PO TABS: ORAL_TABLET | ORAL | 0 refills | Status: DC

## 2019-03-06 MED ORDER — CEFPROZIL 500 MG PO TABS: 500.0000 mg | ORAL_TABLET | Freq: Two times a day (BID) | ORAL | 0 refills | Status: DC

## 2019-03-06 MED ORDER — AMLODIPINE BESYLATE 5 MG PO TABS: ORAL_TABLET | ORAL | 1 refills | Status: DC

## 2019-03-06 NOTE — Progress Notes (Signed)
   Subjective:  Audio only  Patient ID: Shawn Fisher, male    DOB: 27-Jul-1960, 58 y.o.   MRN: [id number]  HPI  Patient calls to discuss a flare of COPD for several weeks. Patient states he is working at a [employer] and it has gotten in his lungs and he is coughing a lot more than normal.  Virtual Visit via Video Note  I connected with Shawn Fisher on 03/06/19 at  3:30 PM EST by a video enabled telemedicine application and verified that I am speaking with the correct person using two identifiers.  Location: Patient: home Provider: office   I discussed the limitations of evaluation and management by telemedicine and the availability of in person appointments. The patient expressed understanding and agreed to proceed.  History of Present Illness:    Observations/Objective:   Assessment and Plan:   Follow Up Instructions:    I discussed the assessment and treatment plan with the patient. The patient was provided an opportunity to ask questions and all were answered. The patient agreed with the plan and demonstrated an understanding of the instructions.   The patient was advised to call back or seek an in-person evaluation if the symptoms worsen or if the condition fails to improve as anticipated.  I provided 18 minutes of non-face-to-face time during this encounter.  Patient has had more chemical exposures of weight through work.  This is flared up his COPD.  Ongoing wheezing and cough.  He had been on a preventative Advair but came off of it when it ran out.  Feels he needs to get back on it  Also reports productive cough for last 5 days.  Also associated with fatigue.  Also more shortness of breath.  No obvious fever no achiness no muscle or joint aches no headache   Review of Systems See above    Objective:   Physical Exam  Virtual Content      Assessment & Plan:  Impression exacerbation of COPD.  Both chronically and short-term.  For the short-term will  cover with antibiotics prednisone taper and COVID-19 testing rationale discussed.  For the long-term issues will reinitiate Advair rationale discussed

## 2019-03-07 ENCOUNTER — Other Ambulatory Visit: Payer: Self-pay | Admitting: *Deleted

## 2019-03-07 DIAGNOSIS — Z20822 Contact with and (suspected) exposure to covid-19: Secondary | ICD-10-CM

## 2019-03-09 LAB — NOVEL CORONAVIRUS, NAA: SARS-CoV-2, NAA: NOT DETECTED

## 2019-03-10 ENCOUNTER — Telehealth: Payer: Self-pay | Admitting: *Deleted

## 2019-03-10 NOTE — Telephone Encounter (Signed)
Patient was wanting results from Covid test. Test shows -Not detected

## 2019-03-10 NOTE — Telephone Encounter (Signed)
Patient notified his Covid test was negative. Patient verbalized understanding

## 2019-03-10 NOTE — Telephone Encounter (Signed)
Notify pt negative

## 2019-05-11 ENCOUNTER — Encounter: Payer: Self-pay | Admitting: Family Medicine

## 2019-11-02 IMAGING — DX DG WRIST COMPLETE 3+V*R*
4 series · 4 of 4 positions shown · non-contrast
Comparison: None.

CLINICAL DATA: Fall with right wrist pain

EXAM:
RIGHT WRIST - COMPLETE 3+ VIEW

[wrist pa]
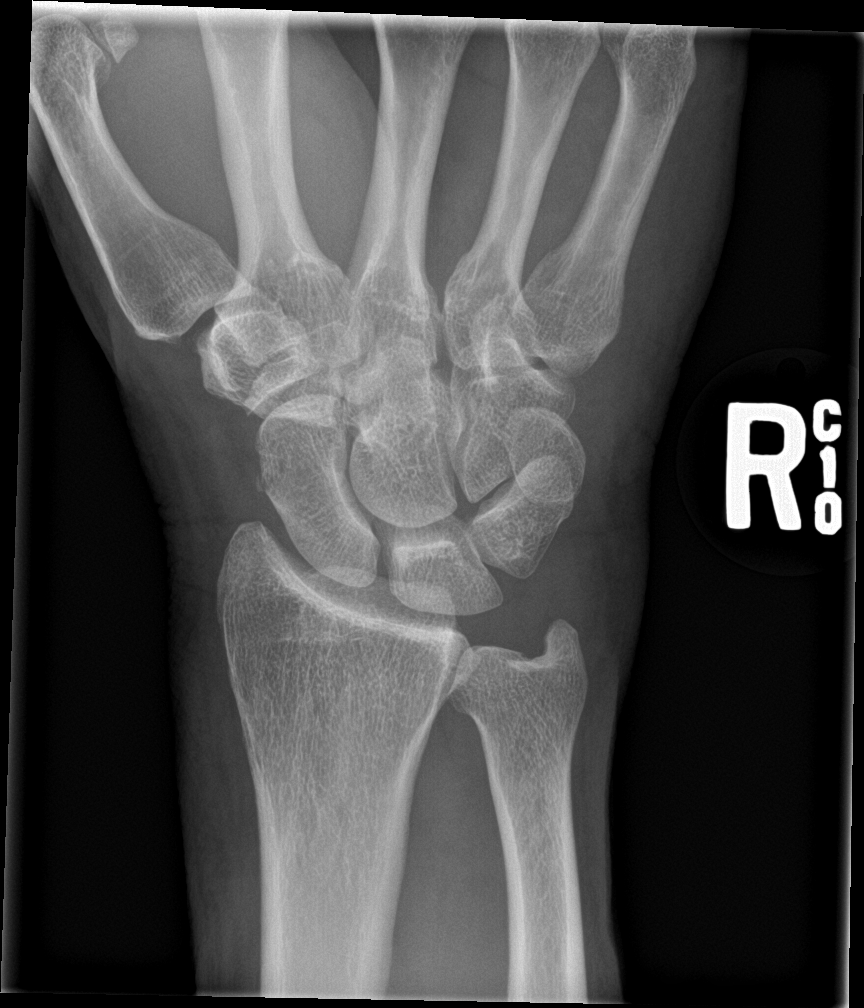

[wrist obl]
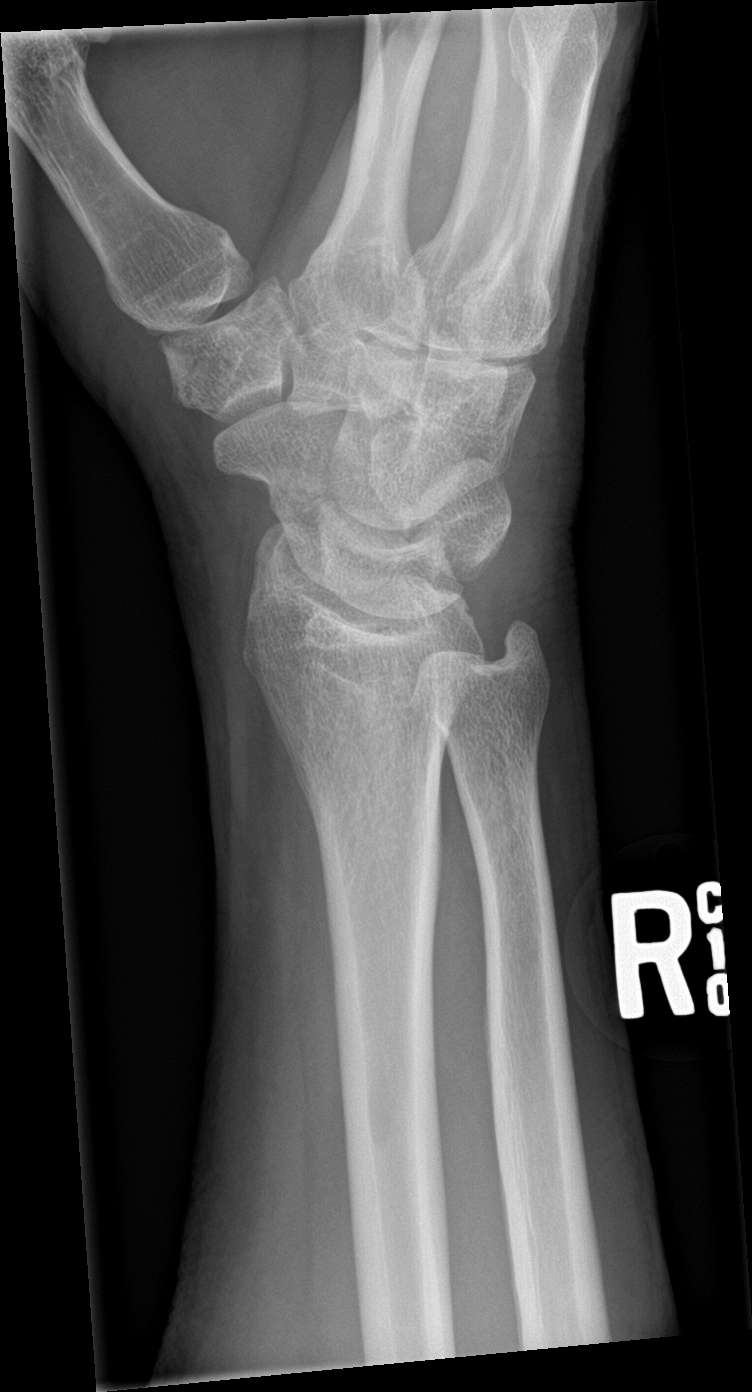

[wrist lat]
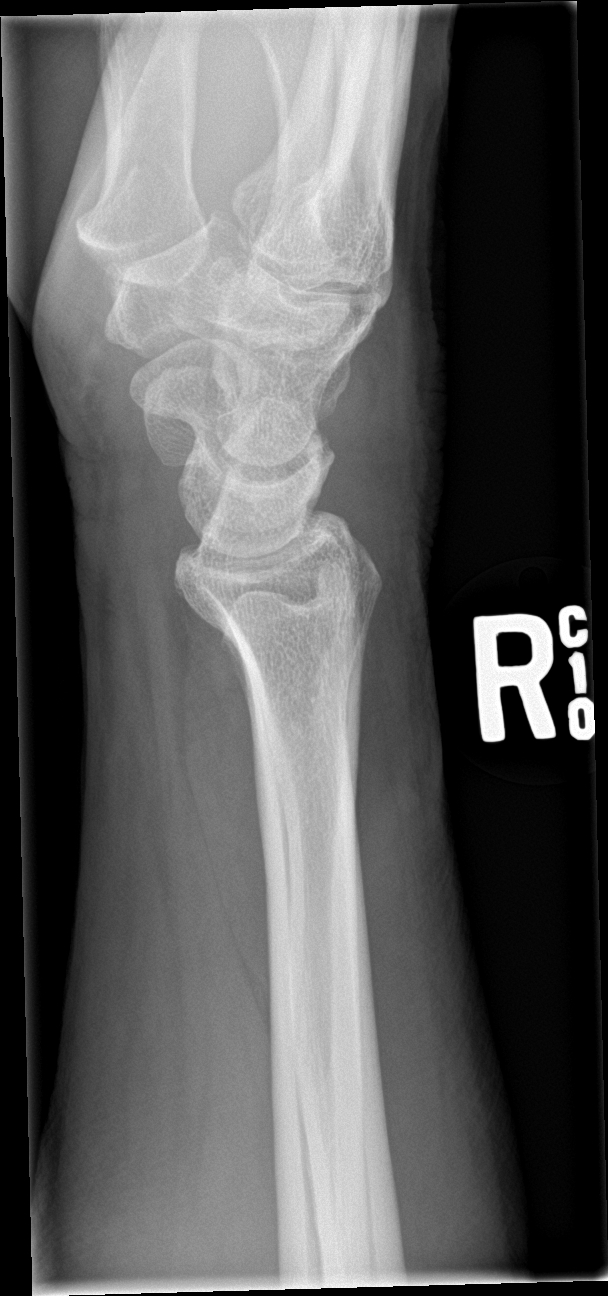

[wrist navicular]
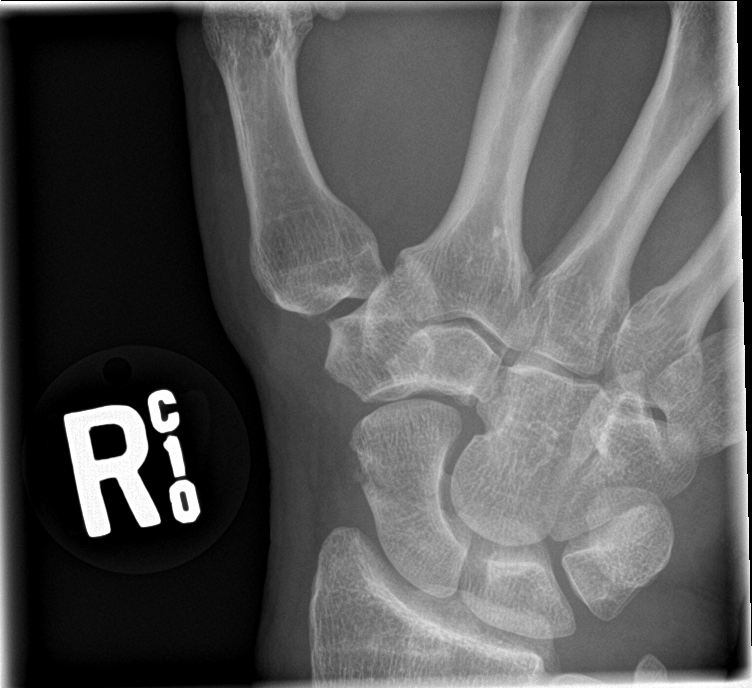

[4 of 4 positions shown; findings below may reference images not displayed]

FINDINGS: Trabecular distortion and lateral cortex irregularity of the
scaphoid waist, likely a nondisplaced fracture. Normal wrist
alignment.
IMPRESSION: Suspect a nondisplaced scaphoid waist fracture.

## 2021-01-10 ENCOUNTER — Encounter: Payer: Self-pay | Admitting: Emergency Medicine

## 2021-01-10 ENCOUNTER — Other Ambulatory Visit: Payer: Self-pay

## 2021-01-10 ENCOUNTER — Ambulatory Visit
Admission: EM | Admit: 2021-01-10 | Discharge: 2021-01-10 | Disposition: A | Discharge status: Home / Self Care | Payer: BC Managed Care – PPO | Attending: Family Medicine | Admitting: Family Medicine

## 2021-01-10 DIAGNOSIS — J441 Chronic obstructive pulmonary disease with (acute) exacerbation: Secondary | ICD-10-CM

## 2021-01-10 DIAGNOSIS — Z20822 Contact with and (suspected) exposure to covid-19: Secondary | ICD-10-CM | POA: Diagnosis not present

## 2021-01-10 MED ORDER — AZITHROMYCIN 250 MG PO TABS: ORAL_TABLET | ORAL | 0 refills | Status: DC

## 2021-01-10 MED ORDER — PREDNISONE 20 MG PO TABS: 40.0000 mg | ORAL_TABLET | Freq: Every day | ORAL | 0 refills | Status: DC

## 2021-01-10 MED ORDER — FLUTICASONE-SALMETEROL 250-50 MCG/ACT IN AEPB: 1.0000 | INHALATION_SPRAY | Freq: Two times a day (BID) | RESPIRATORY_TRACT | 2 refills | Status: DC

## 2021-01-10 MED ORDER — ALBUTEROL SULFATE HFA 108 (90 BASE) MCG/ACT IN AERS: 2.0000 | INHALATION_SPRAY | Freq: Four times a day (QID) | RESPIRATORY_TRACT | 2 refills | Status: DC | PRN

## 2021-01-10 NOTE — ED Provider Notes (Signed)
[facility]    CSN: [id number] Arrival date & time: 01/10/21  0813      History   Chief Complaint No chief complaint on file.   HPI Shawn Fisher is a 60 y.o. male.   Patient presenting today with 1 week history of productive cough, wheezing, shortness of breath, congestion and now nausea and vomiting since yesterday with coughing fits.  Denies known fever, chills, chest pain, abdominal pain, diarrhea.  He states he lives with his mom who was diagnosed with COVID earlier this week and now has pneumonia.  He has a history of COPD and has been off of his inhalers, both albuterol and maintenance Advair, for quite some time due to cost.  Not trying anything over-the-counter at this time for symptoms.   Past Medical History:  Diagnosis Date   COPD (chronic obstructive pulmonary disease) (HCC)     Patient Active Problem List   Diagnosis Date Noted   Essential hypertension 05/01/2018   COPD mixed type (HCC) 10/15/2017    Past Surgical History:  Procedure Laterality Date   HAND SURGERY Left        Home Medications    Prior to Admission medications   Medication Sig Start Date End Date Taking? Authorizing Provider  azithromycin (ZITHROMAX) 250 MG tablet Take first 2 tablets together, then 1 every day until finished. 01/10/21  Yes Particia Nearing, PA-C  fluticasone-salmeterol (ADVAIR DISKUS) 250-50 MCG/ACT AEPB Inhale 1 puff into the lungs in the morning and at bedtime. 01/10/21  Yes Particia Nearing, PA-C  predniSONE (DELTASONE) 20 MG tablet Take 2 tablets (40 mg total) by mouth daily with breakfast. 01/10/21  Yes Particia Nearing, PA-C  albuterol (VENTOLIN HFA) 108 (90 Base) MCG/ACT inhaler Inhale 2 puffs into the lungs every 6 (six) hours as needed for wheezing or shortness of breath. 01/10/21   Particia Nearing, PA-C  amLODipine (NORVASC) 5 MG tablet One po QHS 03/06/19   Merlyn Albert, MD  cefPROZIL (CEFZIL) 500 MG tablet Take 1 tablet  (500 mg total) by mouth 2 (two) times daily. 03/06/19   Merlyn Albert, MD  fluticasone-salmeterol (ADVAIR HFA) [phone] MCG/ACT inhaler Inhale 2 puffs twice a day 03/06/19   Merlyn Albert, MD  HYDROcodone-acetaminophen (NORCO/VICODIN) 5-325 MG tablet Take 1 tablet by mouth every 4 (four) hours as needed. 02/28/18   Burgess Amor, PA-C  naproxen (NAPROSYN) 500 MG tablet Take 1 tablet (500 mg total) by mouth 2 (two) times daily. 02/28/18   Burgess Amor, PA-C  predniSONE (DELTASONE) 20 MG tablet 3qd for 2d then 2qd for 2d then 1qd for 2d 03/06/19   Merlyn Albert, MD    Family History History reviewed. No pertinent family history.  Social History Social History   Tobacco Use   Smoking status: Every Day   Smokeless tobacco: Never     Allergies   Patient has no known allergies.   Review of Systems Review of Systems Per HPI  Physical Exam Triage Vital Signs ED Triage Vitals  Enc Vitals Group     BP 01/10/21 0831 (!) 163/106     Pulse Rate 01/10/21 0831 80     Resp 01/10/21 0831 18     Temp 01/10/21 0831 98 F (36.7 C)     Temp Source 01/10/21 0831 Oral     SpO2 01/10/21 0831 92 %     Weight --      Height --      Head Circumference --  Peak Flow --      Pain Score 01/10/21 0829 0     Pain Loc --      Pain Edu? --      Excl. in GC? --    No data found.  Updated Vital Signs BP (!) 163/106 (BP Location: Right Arm)   Pulse 80   Temp 98 F (36.7 C) (Oral)   Resp 18   SpO2 92%   Visual Acuity Right Eye Distance:   Left Eye Distance:   Bilateral Distance:    Right Eye Near:   Left Eye Near:    Bilateral Near:     Physical Exam Vitals and nursing note reviewed.  Constitutional:      Appearance: Normal appearance.  HENT:     Head: Atraumatic.     Right Ear: Tympanic membrane normal.     Left Ear: Tympanic membrane normal.     Nose: Rhinorrhea present.     Mouth/Throat:     Mouth: Mucous membranes are moist.     Pharynx: Posterior oropharyngeal  erythema present.  Eyes:     Extraocular Movements: Extraocular movements intact.     Conjunctiva/sclera: Conjunctivae normal.  Cardiovascular:     Rate and Rhythm: Normal rate and regular rhythm.     Heart sounds: Normal heart sounds.  Pulmonary:     Effort: Pulmonary effort is normal. No respiratory distress.     Breath sounds: Wheezing present. No rales.  Abdominal:     General: Bowel sounds are normal. There is no distension.     Palpations: Abdomen is soft.     Tenderness: There is no abdominal tenderness. There is no guarding.  Musculoskeletal:        General: Normal range of motion.     Cervical back: Normal range of motion and neck supple.  Skin:    General: Skin is warm and dry.  Neurological:     General: No focal deficit present.     Mental Status: He is oriented to person, place, and time.  Psychiatric:        Mood and Affect: Mood normal.        Thought Content: Thought content normal.        Judgment: Judgment normal.     UC Treatments / Results  Labs (all labs ordered are listed, but only abnormal results are displayed) Labs Reviewed - No data to display  EKG   Radiology No results found.  Procedures Procedures (including critical care time)  Medications Ordered in UC Medications - No data to display  Initial Impression / Assessment and Plan / UC Course  I have reviewed the triage vital signs and the nursing notes.  Pertinent labs & imaging results that were available during my care of the patient were reviewed by me and considered in my medical decision making (see chart for details).     Declines COVID testing as he has a known family exposure so we will assume initially his COPD exacerbation came from COVID-19 or similar illness.  We will treat for COPD exacerbation with prednisone, azithromycin, albuterol inhaler and restart his Advair for maintenance.  Discussed over-the-counter supportive medications and home care.  Return for acutely  worsening symptoms.  Follow-up with PCP for refills on his maintenance regimen for his COPD.  Final Clinical Impressions(s) / UC Diagnoses   Final diagnoses:  COPD exacerbation (HCC)  Exposure to COVID-19 virus   Discharge Instructions   None    ED Prescriptions  Medication Sig Dispense Auth. Provider   albuterol (VENTOLIN HFA) 108 (90 Base) MCG/ACT inhaler Inhale 2 puffs into the lungs every 6 (six) hours as needed for wheezing or shortness of breath. 1 each Particia Nearing, PA-C   fluticasone-salmeterol (ADVAIR DISKUS) 250-50 MCG/ACT AEPB Inhale 1 puff into the lungs in the morning and at bedtime. 1 each Particia Nearing, PA-C   predniSONE (DELTASONE) 20 MG tablet Take 2 tablets (40 mg total) by mouth daily with breakfast. 10 tablet Particia Nearing, PA-C   azithromycin (ZITHROMAX) 250 MG tablet Take first 2 tablets together, then 1 every day until finished. 6 tablet Particia Nearing, [place]      PDMP not reviewed this encounter.   Particia Nearing, [place] 01/10/21 1918

## 2021-01-10 NOTE — ED Triage Notes (Signed)
SOB x 1 week with cough that is worse at night time.  Productive cough with clear and yellow sputum. Nasal congestion.  States nausea and vomiting yesterday

## 2022-07-31 ENCOUNTER — Ambulatory Visit (HOSPITAL_COMMUNITY)
Admission: RE | Admit: 2022-07-31 | Discharge: 2022-07-31 | Disposition: A | Discharge status: Home / Self Care | Payer: BC Managed Care – PPO | Source: Ambulatory Visit | Attending: Adult Health | Admitting: Adult Health

## 2022-07-31 DIAGNOSIS — I10 Essential (primary) hypertension: Secondary | ICD-10-CM | POA: Insufficient documentation

## 2022-07-31 NOTE — Progress Notes (Signed)
[name] was a scheduled out-patient EKG. His EKG preliminary results were abnormal, per patient he does not have andy cardiac issues. I felt this to be concerning and did consult with the emergency room physician who suggested the patient be evaluated. The patient declined evaluation. He stated earlier he felt tired but otherwise fine.  I did escort patient via wheelchair to main entrance. The ordering provider, Colleen Can, NP, office was sent a fax of Shawn Fisher's EKG and a voicemail was also left. As of 1148 on 07/31/2022 not response received as yet.

## 2022-10-29 ENCOUNTER — Emergency Department (HOSPITAL_COMMUNITY): Payer: 59

## 2022-10-29 ENCOUNTER — Inpatient Hospital Stay (HOSPITAL_COMMUNITY)
Admission: EM | Admit: 2022-10-29 | Discharge: 2022-11-05 | DRG: 065 | Disposition: E | Payer: 59 | Attending: Internal Medicine | Admitting: Internal Medicine

## 2022-10-29 ENCOUNTER — Other Ambulatory Visit: Payer: Self-pay

## 2022-10-29 DIAGNOSIS — J449 Chronic obstructive pulmonary disease, unspecified: Secondary | ICD-10-CM | POA: Diagnosis not present

## 2022-10-29 DIAGNOSIS — N179 Acute kidney failure, unspecified: Secondary | ICD-10-CM | POA: Diagnosis present

## 2022-10-29 DIAGNOSIS — I4901 Ventricular fibrillation: Secondary | ICD-10-CM | POA: Diagnosis not present

## 2022-10-29 DIAGNOSIS — R791 Abnormal coagulation profile: Secondary | ICD-10-CM | POA: Diagnosis not present

## 2022-10-29 DIAGNOSIS — G936 Cerebral edema: Secondary | ICD-10-CM | POA: Diagnosis not present

## 2022-10-29 DIAGNOSIS — S36119A Unspecified injury of liver, initial encounter: Secondary | ICD-10-CM | POA: Diagnosis not present

## 2022-10-29 DIAGNOSIS — G8929 Other chronic pain: Secondary | ICD-10-CM | POA: Diagnosis present

## 2022-10-29 DIAGNOSIS — R55 Syncope and collapse: Secondary | ICD-10-CM | POA: Diagnosis not present

## 2022-10-29 DIAGNOSIS — R4182 Altered mental status, unspecified: Secondary | ICD-10-CM | POA: Diagnosis not present

## 2022-10-29 DIAGNOSIS — D72829 Elevated white blood cell count, unspecified: Secondary | ICD-10-CM | POA: Diagnosis present

## 2022-10-29 DIAGNOSIS — S2243XA Multiple fractures of ribs, bilateral, initial encounter for closed fracture: Secondary | ICD-10-CM | POA: Diagnosis not present

## 2022-10-29 DIAGNOSIS — Z515 Encounter for palliative care: Secondary | ICD-10-CM | POA: Diagnosis not present

## 2022-10-29 DIAGNOSIS — R579 Shock, unspecified: Secondary | ICD-10-CM | POA: Diagnosis not present

## 2022-10-29 DIAGNOSIS — Z66 Do not resuscitate: Secondary | ICD-10-CM | POA: Diagnosis not present

## 2022-10-29 DIAGNOSIS — R404 Transient alteration of awareness: Secondary | ICD-10-CM | POA: Diagnosis not present

## 2022-10-29 DIAGNOSIS — I468 Cardiac arrest due to other underlying condition: Secondary | ICD-10-CM | POA: Diagnosis present

## 2022-10-29 DIAGNOSIS — Z1152 Encounter for screening for COVID-19: Secondary | ICD-10-CM | POA: Diagnosis not present

## 2022-10-29 DIAGNOSIS — I499 Cardiac arrhythmia, unspecified: Secondary | ICD-10-CM | POA: Diagnosis not present

## 2022-10-29 DIAGNOSIS — I469 Cardiac arrest, cause unspecified: Secondary | ICD-10-CM | POA: Diagnosis present

## 2022-10-29 DIAGNOSIS — Z7951 Long term (current) use of inhaled steroids: Secondary | ICD-10-CM | POA: Diagnosis not present

## 2022-10-29 DIAGNOSIS — I63511 Cerebral infarction due to unspecified occlusion or stenosis of right middle cerebral artery: Principal | ICD-10-CM | POA: Diagnosis present

## 2022-10-29 DIAGNOSIS — R092 Respiratory arrest: Secondary | ICD-10-CM | POA: Diagnosis not present

## 2022-10-29 DIAGNOSIS — R0689 Other abnormalities of breathing: Secondary | ICD-10-CM | POA: Diagnosis not present

## 2022-10-29 DIAGNOSIS — E874 Mixed disorder of acid-base balance: Secondary | ICD-10-CM | POA: Diagnosis not present

## 2022-10-29 DIAGNOSIS — I10 Essential (primary) hypertension: Secondary | ICD-10-CM | POA: Diagnosis present

## 2022-10-29 DIAGNOSIS — G9349 Other encephalopathy: Secondary | ICD-10-CM | POA: Diagnosis not present

## 2022-10-29 DIAGNOSIS — I2510 Atherosclerotic heart disease of native coronary artery without angina pectoris: Secondary | ICD-10-CM | POA: Diagnosis not present

## 2022-10-29 DIAGNOSIS — R0989 Other specified symptoms and signs involving the circulatory and respiratory systems: Secondary | ICD-10-CM | POA: Diagnosis not present

## 2022-10-29 DIAGNOSIS — R0902 Hypoxemia: Secondary | ICD-10-CM | POA: Diagnosis not present

## 2022-10-29 DIAGNOSIS — F17210 Nicotine dependence, cigarettes, uncomplicated: Secondary | ICD-10-CM | POA: Diagnosis not present

## 2022-10-29 DIAGNOSIS — S0003XA Contusion of scalp, initial encounter: Secondary | ICD-10-CM | POA: Diagnosis not present

## 2022-10-29 DIAGNOSIS — Z7952 Long term (current) use of systemic steroids: Secondary | ICD-10-CM | POA: Diagnosis not present

## 2022-10-29 DIAGNOSIS — R7401 Elevation of levels of liver transaminase levels: Secondary | ICD-10-CM | POA: Diagnosis present

## 2022-10-29 DIAGNOSIS — R569 Unspecified convulsions: Secondary | ICD-10-CM | POA: Diagnosis not present

## 2022-10-29 DIAGNOSIS — J432 Centrilobular emphysema: Secondary | ICD-10-CM | POA: Diagnosis not present

## 2022-10-29 DIAGNOSIS — Z4682 Encounter for fitting and adjustment of non-vascular catheter: Secondary | ICD-10-CM | POA: Diagnosis not present

## 2022-10-29 DIAGNOSIS — J40 Bronchitis, not specified as acute or chronic: Secondary | ICD-10-CM | POA: Diagnosis not present

## 2022-10-29 DIAGNOSIS — R29818 Other symptoms and signs involving the nervous system: Secondary | ICD-10-CM | POA: Diagnosis not present

## 2022-10-29 DIAGNOSIS — R918 Other nonspecific abnormal finding of lung field: Secondary | ICD-10-CM | POA: Diagnosis not present

## 2022-10-29 LAB — CBC WITH DIFFERENTIAL/PLATELET
Abs Immature Granulocytes: 0.11 10*3/uL — ABNORMAL HIGH (ref 0.00–0.07)
Basophils Absolute: 0 10*3/uL (ref 0.0–0.1)
Basophils Relative: 0 %
Eosinophils Absolute: 0 10*3/uL (ref 0.0–0.5)
Eosinophils Relative: 0 %
HCT: 53.6 % — ABNORMAL HIGH (ref 39.0–52.0)
Hemoglobin: 15.9 g/dL (ref 13.0–17.0)
Immature Granulocytes: 1 %
Lymphocytes Relative: 10 %
Lymphs Abs: 1.8 10*3/uL (ref 0.7–4.0)
MCH: 26.2 pg (ref 26.0–34.0)
MCHC: 29.7 g/dL — ABNORMAL LOW (ref 30.0–36.0)
MCV: 88.4 fL (ref 80.0–100.0)
Monocytes Absolute: 2.2 10*3/uL — ABNORMAL HIGH (ref 0.1–1.0)
Monocytes Relative: 12 %
Neutro Abs: 14.2 10*3/uL — ABNORMAL HIGH (ref 1.7–7.7)
Neutrophils Relative %: 77 %
Platelets: 273 10*3/uL (ref 150–400)
RBC: 6.06 MIL/uL — ABNORMAL HIGH (ref 4.22–5.81)
RDW: 16.6 % — ABNORMAL HIGH (ref 11.5–15.5)
WBC: 18.4 10*3/uL — ABNORMAL HIGH (ref 4.0–10.5)
nRBC: 0 % (ref 0.0–0.2)

## 2022-10-29 LAB — URINALYSIS, W/ REFLEX TO CULTURE (INFECTION SUSPECTED)
Bilirubin Urine: NEGATIVE
Glucose, UA: 150 mg/dL — AB
Ketones, ur: NEGATIVE mg/dL
Leukocytes,Ua: NEGATIVE
Nitrite: NEGATIVE
Protein, ur: >=300 mg/dL — AB
Specific Gravity, Urine: 1.016 (ref 1.005–1.030)
pH: 6 (ref 5.0–8.0)

## 2022-10-29 LAB — BLOOD GAS, ARTERIAL
Acid-base deficit: 15.1 mmol/L — ABNORMAL HIGH (ref 0.0–2.0)
Bicarbonate: 16 mmol/L — ABNORMAL LOW (ref 20.0–28.0)
Drawn by: 22223
Drawn by: 22226
FIO2: 100 %
O2 Saturation: 89.8 %
O2 Saturation: 92.5 %
Patient temperature: 37
Patient temperature: 35.6
pCO2 arterial: 77 mmHg (ref 32–48)
pCO2 arterial: 55 mmHg — ABNORMAL HIGH (ref 32–48)
pH, Arterial: <6.95 — CL (ref 7.35–7.45)
pH, Arterial: 7.07 — CL (ref 7.35–7.45)
pO2, Arterial: 86 mmHg (ref 83–108)
pO2, Arterial: 72 mmHg — ABNORMAL LOW (ref 83–108)

## 2022-10-29 LAB — CULTURE, BLOOD (ROUTINE X 2)
Special Requests: ADEQUATE
Special Requests: ADEQUATE

## 2022-10-29 LAB — TROPONIN I (HIGH SENSITIVITY)
Troponin I (High Sensitivity): 160 ng/L (ref <18)
Troponin I (High Sensitivity): 454 ng/L (ref <18)

## 2022-10-29 LAB — PROTIME-INR
INR: 1.9 — ABNORMAL HIGH (ref 0.8–1.2)
Prothrombin Time: 22.1 seconds — ABNORMAL HIGH (ref 11.4–15.2)

## 2022-10-29 LAB — COMPREHENSIVE METABOLIC PANEL
ALT: 30 U/L (ref 0–44)
AST: 56 U/L — ABNORMAL HIGH (ref 15–41)
Albumin: 3.3 g/dL — ABNORMAL LOW (ref 3.5–5.0)
Alkaline Phosphatase: 147 U/L — ABNORMAL HIGH (ref 38–126)
Anion gap: 16 — ABNORMAL HIGH (ref 5–15)
BUN: 16 mg/dL (ref 8–23)
CO2: 18 mmol/L — ABNORMAL LOW (ref 22–32)
Calcium: 8 mg/dL — ABNORMAL LOW (ref 8.9–10.3)
Chloride: 102 mmol/L (ref 98–111)
Creatinine, Ser: 1.04 mg/dL (ref 0.61–1.24)
GFR, Estimated: >60 mL/min (ref >60)
Glucose, Bld: 129 mg/dL — ABNORMAL HIGH (ref 70–99)
Potassium: 5 mmol/L (ref 3.5–5.1)
Sodium: 136 mmol/L (ref 135–145)
Total Bilirubin: 3.7 mg/dL — ABNORMAL HIGH (ref 0.3–1.2)
Total Protein: 7.2 g/dL (ref 6.5–8.1)

## 2022-10-29 LAB — LACTIC ACID, PLASMA: Lactic Acid, Venous: 8.4 mmol/L (ref 0.5–1.9)

## 2022-10-29 LAB — RESP PANEL BY RT-PCR (RSV, FLU A&B, COVID)  RVPGX2
Influenza A by PCR: NEGATIVE
Influenza B by PCR: NEGATIVE
Resp Syncytial Virus by PCR: NEGATIVE
SARS Coronavirus 2 by RT PCR: NEGATIVE

## 2022-10-29 LAB — GLUCOSE, CAPILLARY: Glucose-Capillary: 196 mg/dL — ABNORMAL HIGH (ref 70–99)

## 2022-10-29 LAB — APTT: aPTT: 33 seconds (ref 24–36)

## 2022-10-29 MED ORDER — REVEFENACIN 175 MCG/3ML IN SOLN: 175.0000 ug | Freq: Every day | RESPIRATORY_TRACT | Status: DC

## 2022-10-29 MED ORDER — BUSPIRONE HCL 15 MG PO TABS: 30.0000 mg | ORAL_TABLET | Freq: Three times a day (TID) | ORAL | Status: DC | PRN

## 2022-10-29 MED ORDER — SODIUM CHLORIDE 0.9 % IV BOLUS
2000.0000 mL | Freq: Once | INTRAVENOUS | Status: AC
  Administered 2022-10-29: 2000 mL via INTRAVENOUS

## 2022-10-29 MED ORDER — MAGNESIUM SULFATE 2 GM/50ML IV SOLN: 2.0000 g | Freq: Once | INTRAVENOUS | Status: DC | PRN

## 2022-10-29 MED ORDER — SODIUM BICARBONATE 8.4 % IV SOLN
50.0000 meq | Freq: Once | INTRAVENOUS | Status: AC
  Administered 2022-10-29: 50 meq via INTRAVENOUS
  Filled 2022-10-29: qty 50

## 2022-10-29 MED ORDER — FENTANYL BOLUS VIA INFUSION: 50.0000 ug | INTRAVENOUS | Status: DC | PRN

## 2022-10-29 MED ORDER — ETOMIDATE 2 MG/ML IV SOLN
INTRAVENOUS | Status: AC
  Administered 2022-10-29: 20 mg
  Filled 2022-10-29: qty 10

## 2022-10-29 MED ORDER — FENTANYL CITRATE PF 50 MCG/ML IJ SOSY: 50.0000 ug | PREFILLED_SYRINGE | Freq: Once | INTRAMUSCULAR | Status: DC

## 2022-10-29 MED ORDER — ACETAMINOPHEN 160 MG/5ML PO SOLN: 650.0000 mg | ORAL | Status: DC

## 2022-10-29 MED ORDER — NOREPINEPHRINE 4 MG/250ML-% IV SOLN
2.0000 ug/min | INTRAVENOUS | Status: DC
  Administered 2022-10-30: 10 ug/min via INTRAVENOUS
  Filled 2022-10-29: qty 500

## 2022-10-29 MED ORDER — SODIUM CHLORIDE 0.9 % IV SOLN
250.0000 mL | INTRAVENOUS | Status: DC
  Administered 2022-10-30: 250 mL via INTRAVENOUS

## 2022-10-29 MED ORDER — ARFORMOTEROL TARTRATE 15 MCG/2ML IN NEBU
15.0000 ug | INHALATION_SOLUTION | Freq: Two times a day (BID) | RESPIRATORY_TRACT | Status: DC
  Filled 2022-10-29 (×2): qty 2

## 2022-10-29 MED ORDER — LACTATED RINGERS IV SOLN: INTRAVENOUS | Status: DC

## 2022-10-29 MED ORDER — ATROPINE SULFATE 1 MG/ML IV SOLN
0.4000 mg | Freq: Once | INTRAVENOUS | Status: AC
  Administered 2022-10-29: 0.4 mg via INTRAVENOUS

## 2022-10-29 MED ORDER — ONDANSETRON HCL 4 MG/2ML IJ SOLN: 4.0000 mg | Freq: Four times a day (QID) | INTRAMUSCULAR | Status: DC | PRN

## 2022-10-29 MED ORDER — SODIUM CHLORIDE 0.9 % IV SOLN
500.0000 mg | INTRAVENOUS | Status: DC
  Administered 2022-10-29: 500 mg via INTRAVENOUS
  Filled 2022-10-29: qty 5

## 2022-10-29 MED ORDER — METHYLPREDNISOLONE SODIUM SUCC 125 MG IJ SOLR
80.0000 mg | Freq: Every day | INTRAMUSCULAR | Status: DC
  Administered 2022-10-30: 80 mg via INTRAVENOUS
  Filled 2022-10-29: qty 2

## 2022-10-29 MED ORDER — POLYETHYLENE GLYCOL 3350 17 G PO PACK: 17.0000 g | PACK | Freq: Every day | ORAL | Status: DC

## 2022-10-29 MED ORDER — NOREPINEPHRINE 4 MG/250ML-% IV SOLN
0.0000 ug/min | INTRAVENOUS | Status: DC
  Filled 2022-10-29: qty 250

## 2022-10-29 MED ORDER — EPINEPHRINE 1 MG/10ML IJ SOSY
PREFILLED_SYRINGE | INTRAMUSCULAR | Status: AC
  Administered 2022-10-29 (×5): 1 mg
  Filled 2022-10-29: qty 30

## 2022-10-29 MED ORDER — NOREPINEPHRINE 4 MG/250ML-% IV SOLN
INTRAVENOUS | Status: AC
  Administered 2022-10-29: 2 ug/min via INTRAVENOUS
  Filled 2022-10-29: qty 250

## 2022-10-29 MED ORDER — PIPERACILLIN-TAZOBACTAM 3.375 G IVPB
3.3750 g | Freq: Three times a day (TID) | INTRAVENOUS | Status: DC
  Administered 2022-10-30 (×2): 3.375 g via INTRAVENOUS
  Filled 2022-10-29 (×3): qty 50

## 2022-10-29 MED ORDER — ACETAMINOPHEN 160 MG/5ML PO SOLN: 650.0000 mg | ORAL | Status: DC | PRN

## 2022-10-29 MED ORDER — ACETAMINOPHEN 650 MG RE SUPP: 650.0000 mg | RECTAL | Status: DC

## 2022-10-29 MED ORDER — STERILE WATER FOR INJECTION IV SOLN
INTRAVENOUS | Status: DC
  Filled 2022-10-29 (×2): qty 1000

## 2022-10-29 MED ORDER — ACETAMINOPHEN 325 MG PO TABS: 650.0000 mg | ORAL_TABLET | ORAL | Status: DC | PRN

## 2022-10-29 MED ORDER — INSULIN ASPART 100 UNIT/ML IJ SOLN
0.0000 [IU] | INTRAMUSCULAR | Status: DC
  Administered 2022-10-30: 3 [IU] via SUBCUTANEOUS

## 2022-10-29 MED ORDER — SUCCINYLCHOLINE CHLORIDE 200 MG/10ML IV SOSY
PREFILLED_SYRINGE | INTRAVENOUS | Status: AC
  Administered 2022-10-29: 120 mg
  Filled 2022-10-29: qty 10

## 2022-10-29 MED ORDER — SODIUM CHLORIDE 0.9 % IV SOLN
2.0000 g | INTRAVENOUS | Status: DC
  Administered 2022-10-29: 2 g via INTRAVENOUS
  Filled 2022-10-29: qty 20

## 2022-10-29 MED ORDER — PROPOFOL 1000 MG/100ML IV EMUL: 0.0000 ug/kg/min | INTRAVENOUS | Status: DC

## 2022-10-29 MED ORDER — DOCUSATE SODIUM 50 MG/5ML PO LIQD: 100.0000 mg | Freq: Two times a day (BID) | ORAL | Status: DC

## 2022-10-29 MED ORDER — CHLORHEXIDINE GLUCONATE CLOTH 2 % EX PADS: 6.0000 | MEDICATED_PAD | Freq: Every day | CUTANEOUS | Status: DC

## 2022-10-29 MED ORDER — BUDESONIDE 0.5 MG/2ML IN SUSP: 0.5000 mg | Freq: Two times a day (BID) | RESPIRATORY_TRACT | Status: DC

## 2022-10-29 MED ORDER — FAMOTIDINE 20 MG PO TABS: 20.0000 mg | ORAL_TABLET | Freq: Two times a day (BID) | ORAL | Status: DC

## 2022-10-29 MED ORDER — ACETAMINOPHEN 325 MG PO TABS: 650.0000 mg | ORAL_TABLET | ORAL | Status: DC

## 2022-10-29 MED ORDER — FENTANYL 2500MCG IN NS 250ML (10MCG/ML) PREMIX INFUSION
50.0000 ug/h | INTRAVENOUS | Status: DC
  Filled 2022-10-29: qty 250

## 2022-10-29 MED ORDER — ACETAMINOPHEN 650 MG RE SUPP: 650.0000 mg | RECTAL | Status: DC | PRN

## 2022-10-29 NOTE — ED Notes (Signed)
Pt arrival 1831. Pt unresponsive with a pulse and bag valve. 18 started in rt ac and 20 started in LT ac at 1835. Bag valve transferred at 1837. Samuel Bouche started at [employer]. 20mg  of etomidate at 1837. 120mg  of succs at 1837. Pt intubated at 1839. Bolus with pressure bag 1839 x2. 1mg  epi 1841. CPR 1844. 1845 no pulse. Bolus 1844. Epi 1845. Faint pulse at 1847. Pt began to code again at 1905. Epi given at 1905, 1/2mg  atropine given ant 1904. Pt re-coded at 1905. Epi at 1906. Vfib at 1907. Shock delivered 1907. CPR re-started 1910. Pulse at 1911. Levo started at 1911 at 2 mg

## 2022-10-29 NOTE — ED Provider Notes (Addendum)
[place] EMERGENCY DEPARTMENT AT [facility] Provider Note   CSN: [id number] Arrival date & time: 10/20/2022  1830     History  Chief Complaint  Patient presents with   Respiratory Arrest    Shawn Fisher is a 62 y.o. male.  Patient with COPD.  He was found on the ground outside confused and with decreased respirations.  He did speak with paramedics.  The paramedics stated that as soon as he moved to the stretcher he arrested.  Patient had a King airway and when he arrived in the emergency department  The history is provided by the EMS personnel. No language interpreter was used.  Shortness of Breath Severity:  Severe Onset quality:  Sudden Timing:  Constant Progression:  Unchanged Chronicity:  New Context: not smoke exposure   Relieved by:  Nothing Worsened by:  Nothing Ineffective treatments:  None tried      Home Medications Prior to Admission medications   Medication Sig Start Date End Date Taking? Authorizing Provider  albuterol (VENTOLIN HFA) 108 (90 Base) MCG/ACT inhaler Inhale 2 puffs into the lungs every 6 (six) hours as needed for wheezing or shortness of breath. 01/10/21   Particia Nearing, PA-C  amLODipine (NORVASC) 5 MG tablet One po QHS 03/06/19   Merlyn Albert, MD  azithromycin (ZITHROMAX) 250 MG tablet Take first 2 tablets together, then 1 every day until finished. 01/10/21   Particia Nearing, PA-C  cefPROZIL (CEFZIL) 500 MG tablet Take 1 tablet (500 mg total) by mouth 2 (two) times daily. 03/06/19   Merlyn Albert, MD  fluticasone-salmeterol (ADVAIR DISKUS) 250-50 MCG/ACT AEPB Inhale 1 puff into the lungs in the morning and at bedtime. 01/10/21   Particia Nearing, PA-C  fluticasone-salmeterol (ADVAIR [facility]) [phone] MCG/ACT inhaler Inhale 2 puffs twice a day 03/06/19   Merlyn Albert, MD  HYDROcodone-acetaminophen (NORCO/VICODIN) 5-325 MG tablet Take 1 tablet by mouth every 4 (four) hours as needed. 02/28/18   Burgess Amor,  PA-C  naproxen (NAPROSYN) 500 MG tablet Take 1 tablet (500 mg total) by mouth 2 (two) times daily. 02/28/18   Burgess Amor, PA-C  predniSONE (DELTASONE) 20 MG tablet 3qd for 2d then 2qd for 2d then 1qd for 2d 03/06/19   Merlyn Albert, MD  predniSONE (DELTASONE) 20 MG tablet Take 2 tablets (40 mg total) by mouth daily with breakfast. 01/10/21   Particia Nearing, PA-C      Allergies    Patient has no known allergies.    Review of Systems   Review of Systems  Unable to perform ROS: Acuity of condition  Respiratory:  Positive for shortness of breath.     Physical Exam Updated Vital Signs BP (!) 116/92   Pulse (!) 132   Resp (!) 24   Ht 5\' 10"  (1.778 m)   SpO2 (!) 84%   BMI 23.96 kg/m  Physical Exam Vitals and nursing note reviewed.  Constitutional:      Appearance: He is well-developed and normal weight.  HENT:     Head: Normocephalic.     Mouth/Throat:     Comments: King airway in place Eyes:     General: No scleral icterus.    Conjunctiva/sclera: Conjunctivae normal.     Comments: Pupils midpoint and nonreactive  Neck:     Thyroid: No thyromegaly.  Cardiovascular:     Rate and Rhythm: Normal rate and regular rhythm.     Heart sounds: No murmur heard.    No  friction rub. No gallop.  Pulmonary:     Breath sounds: No stridor. No wheezing or rales.  Abdominal:     General: There is distension.     Tenderness: There is no rebound.  Musculoskeletal:     Cervical back: Neck supple.     Comments: Patient had much bruising on arms and legs and does not move any extremities  Lymphadenopathy:     Cervical: No cervical adenopathy.  Skin:    Findings: Rash present. No erythema.  Neurological:     Motor: No abnormal muscle tone.     Comments: Patient has a Scientist, clinical (histocompatibility and immunogenetics) airway and and does not respond to any verbal or painful stimuli     ED Results / Procedures / Treatments   Labs (all labs ordered are listed, but only abnormal results are displayed) Labs Reviewed   COMPREHENSIVE METABOLIC PANEL - Abnormal; Notable for the following components:      Result Value   CO2 18 (*)    Glucose, Bld 129 (*)    Calcium 8.0 (*)    Albumin 3.3 (*)    AST 56 (*)    Alkaline Phosphatase 147 (*)    Total Bilirubin 3.7 (*)    Anion gap 16 (*)    All other components within normal limits  CBC WITH DIFFERENTIAL/PLATELET - Abnormal; Notable for the following components:   WBC 18.4 (*)    RBC 6.06 (*)    HCT 53.6 (*)    MCHC 29.7 (*)    RDW 16.6 (*)    Neutro Abs 14.2 (*)    Monocytes Absolute 2.2 (*)    Abs Immature Granulocytes 0.11 (*)    All other components within normal limits  PROTIME-INR - Abnormal; Notable for the following components:   Prothrombin Time 22.1 (*)    INR 1.9 (*)    All other components within normal limits  BLOOD GAS, ARTERIAL - Abnormal; Notable for the following components:   pH, Arterial <6.95 (*)    pCO2 arterial 77 (*)    All other components within normal limits  CULTURE, BLOOD (ROUTINE X 2)  CULTURE, BLOOD (ROUTINE X 2)  APTT  LACTIC ACID, PLASMA  LACTIC ACID, PLASMA  URINALYSIS, W/ REFLEX TO CULTURE (INFECTION SUSPECTED)  TROPONIN I (HIGH SENSITIVITY)  TROPONIN I (HIGH SENSITIVITY)    EKG None  Radiology DG Chest Port 1 View  Result Date: 10/25/2022 CLINICAL DATA:  Intubated EXAM: PORTABLE CHEST 1 VIEW COMPARISON:  09/24/2017 FINDINGS: Endotracheal tube 4.5 cm above the carina. NG tube enters the stomach. Heart and mediastinal contours are within normal limits. Peribronchial thickening. Interstitial prominence most pronounced in the upper lobes. No effusions or acute bony abnormality. IMPRESSION: Endotracheal tube 4.5 cm above the carina. Mild bronchitic changes. Interstitial prominence in the upper lobes, new since prior study. Favor chronic lung disease although atypical infection cannot be completely excluded. Electronically Signed   By: Charlett Nose M.D.   On: 11/03/2022 19:44    Procedures Date/Time: 10/20/2022  5:15 PM  Performed by: Bethann Berkshire, MDComments: Patient was intubated on the first try.  He was given succinylcholine and etomidate.  Position was checked with auscultation and CO2 return        Medications Ordered in ED Medications  lactated ringers infusion (has no administration in time range)  cefTRIAXone (ROCEPHIN) 2 g in sodium chloride 0.9 % 100 mL IVPB (has no administration in time range)  azithromycin (ZITHROMAX) 500 mg in sodium chloride 0.9 % 250 mL IVPB (  has no administration in time range)  sodium bicarbonate injection 50 mEq (has no administration in time range)  succinylcholine (ANECTINE) 200 MG/10ML syringe (120 mg  Given 10/21/2022 1837)  etomidate (AMIDATE) 2 MG/ML injection (20 mg  Given 10/14/2022 1839)  norepinephrine (LEVOPHED) 4-5 MG/250ML-% infusion SOLN (25 mcg/min  Rate/Dose Change 10/17/2022 2003)  sodium chloride 0.9 % bolus 2,000 mL (0 mLs Intravenous Stopped 10/23/2022 1947)  EPINEPHrine (ADRENALIN) 1 MG/10ML injection (1 mg  Given 10/27/2022 1945)  atropine injection 0.4 mg (0.4 mg Intravenous Given 10/17/2022 1906)  sodium bicarbonate injection 50 mEq (50 mEq Intravenous Given 10/08/2022 2033)    ED Course/ Medical Decision Making/ A&P    CRITICAL CARE Performed by: Bethann Berkshire Total critical care time: 125 minutes Critical care time was exclusive of separately billable procedures and treating other patients. Critical care was necessary to treat or prevent imminent or life-threatening deterioration. Critical care was time spent personally by me on the following activities: development of treatment plan with patient and/or surrogate as well as nursing, discussions with consultants, evaluation of patient's response to treatment, examination of patient, obtaining history from patient or surrogate, ordering and performing treatments and interventions, ordering and review of laboratory studies, ordering and review of radiographic studies, pulse oximetry and re-evaluation of  patient's condition.                          Medical Decision Making Amount and/or Complexity of Data Reviewed Labs: ordered. Radiology: ordered. ECG/medicine tests: ordered.  Risk Prescription drug management. Decision regarding hospitalization.   Patient with respiratory arrest.  Patient is a history of COPD.  He was intubated and transferred to [facility] for continued care        Final Clinical Impression(s) / ED Diagnoses Final diagnoses:  None    Rx / DC Orders ED Discharge Orders     None         Bethann Berkshire, MD 11/02/2022 8295    Bethann Berkshire, MD 10/18/2022 1717

## 2022-10-29 NOTE — H&P (Addendum)
NAME:  Shawn Fisher, MRN:  [id number], DOB:  [birth year], LOS: 0 ADMISSION DATE:  10/18/2022, CONSULTATION DATE:  10/15/2022 REFERRING MD:  Estell Harpin, CHIEF COMPLAINT:  cardiac arrest   History of Present Illness:  62 year old man w/ hx of COPD presenting after being found poorly responsive at home by family.  As EMS was transferring him, went into cardiac arrest, King airway placed.  Airway exchanged in ER, and he arrested again.  Unclear total duration of CPR prior to ROSC.    Started on pressors, PCCM to admit to [facility] from [facility].  Pertinent  Medical History  COPD HTN  Significant Hospital Events: Including procedures, antibiotic start and stop dates in addition to other pertinent events   ICU admit for prolonged CPR  Interim History / Subjective:  Admit  Objective   Blood pressure 92/66, pulse (!) 104, temperature (!) 96.2 F (35.7 C), temperature source Bladder, resp. rate (!) 32, height 5\' 10"  (1.778 m), weight 74.8 kg, SpO2 (!) 86%.    Vent Mode: PRVC FiO2 (%):  [100 %] 100 % Set Rate:  [24 bmp-36 bmp] 36 bmp Vt Set:  [550 mL-580 mL] 580 mL PEEP:  [5 cmH20] 5 cmH20 Plateau Pressure:  [22 cmH20-24 cmH20] 24 cmH20   Intake/Output Summary (Last 24 hours) at 10/19/2022 2157 Last data filed at 10/15/2022 2111 Gross per 24 hour  Intake 2015 ml  Output --  Net 2015 ml   Filed Weights   10/19/2022 2026  Weight: 74.8 kg    Examination: General: ill appearing man on vent HENT: pupils small not reactive; no cough/corneals Lungs: rhonci bilaterally, passive on vent Cardiovascular: tachy, ext cool, bedside US severe BiV failure, small anterior pericardial effusion w/o tamponde Abdomen: soft, hypoactive BS Extremities: mottled; L>R pitting edema Neuro: GCS3 Skin: mottled, no rashes  Trop 450 Lactate 8 PFT 2019: FEV1 1.5L, moderate to severe obstruction, lung volumes with air trapping and borderline hyperinfation, DLCO 45%  Resolved Hospital Problem list   N/A  Assessment &  Plan:  [facility], witnessed recurrent seems respiratory driven, known hx of COPD AHRF likely with aspiration Mild AKI, ALI Post arrest shock state Post arrest encephalopathy Post arrest profound acidemia, mixed resp/metabolic Hx COPD NSTEMI, EKG is ugly (ST depression lateral leads) but not STEMI; bedside echo + this could be NOMI related to severe acidemia  - Vent bundle - Check head CT, CTA chest - Check COVID, RVP - Zosyn, azithromycin - Echo - NWG95 - EEG - LE duplex - Trend lactate - Bicarb drip, levo to MAP 65 - Steroids, nebs as ordered - If head CT neg would start heparin drip, asa etc  Best Practice (right click and "Reselect all SmartList Selections" daily)   Diet/type: NPO DVT prophylaxis: SCD GI prophylaxis: H2B Lines: Central line Foley:  Yes, and it is still needed Code Status:  full code Last date of multidisciplinary goals of care discussion [Will update family]  Labs   CBC: Recent Labs  Lab 10/22/2022 1856  WBC 18.4*  NEUTROABS 14.2*  HGB 15.9  HCT 53.6*  MCV 88.4  PLT 273    Basic Metabolic Panel: Recent Labs  Lab 10/25/2022 1856  NA 136  K 5.0  CL 102  CO2 18*  GLUCOSE 129*  BUN 16  CREATININE 1.04  CALCIUM 8.0*   GFR: Estimated Creatinine Clearance: 74.1 mL/min (by C-G formula based on SCr of 1.04 mg/dL). Recent Labs  Lab 10/24/2022 1856 10/25/2022 2100  WBC 18.4*  --  LATICACIDVEN  --  8.4*    Liver Function Tests: Recent Labs  Lab 10/27/2022 1856  AST 56*  ALT 30  ALKPHOS 147*  BILITOT 3.7*  PROT 7.2  ALBUMIN 3.3*   No results for input(s): "LIPASE", "AMYLASE" in the last 168 hours. No results for input(s): "AMMONIA" in the last 168 hours.  ABG    Component Value Date/Time   PHART 7.07 (LL) 11/02/2022 2112   PCO2ART 55 (H) 10/21/2022 2112   PO2ART 72 (L) 10/15/2022 2112   HCO3 16.0 (L) 10/27/2022 2112   ACIDBASEDEF 15.1 (H) 10/12/2022 2112   O2SAT 92.5 10/10/2022 2112     Coagulation Profile: Recent Labs  Lab  10/24/2022 1856  INR 1.9*    Cardiac Enzymes: No results for input(s): "CKTOTAL", "CKMB", "CKMBINDEX", "TROPONINI" in the last 168 hours.  HbA1C: Hgb A1c MFr Bld  Date/Time Value Ref Range Status  09/24/2006 04:05 PM   Final   5.5 (NOTE)   The ADA recommends the following therapeutic goals for glycemic   control related to Hgb A1C measurement:   Goal of Therapy:   < 7.0% Hgb A1C   Action Suggested:  > 8.0% Hgb A1C   Ref:  Diabetes Care, 22, Suppl. 1, 1999    CBG: No results for input(s): "GLUCAP" in the last 168 hours.  Review of Systems:   Comatose  Past Medical History:  He,  has a past medical history of COPD (chronic obstructive pulmonary disease) (HCC).   Surgical History:   Past Surgical History:  Procedure Laterality Date   HAND SURGERY Left      Social History:   reports that he has been smoking. He has never used smokeless tobacco.   Family History:  His family history is not on file.   Allergies No Known Allergies   Home Medications  Prior to Admission medications   Medication Sig Start Date End Date Taking? Authorizing Provider  albuterol (VENTOLIN HFA) 108 (90 Base) MCG/ACT inhaler Inhale 2 puffs into the lungs every 6 (six) hours as needed for wheezing or shortness of breath. 01/10/21   Particia Nearing, PA-C  amLODipine (NORVASC) 5 MG tablet One po QHS 03/06/19   Merlyn Albert, MD  azithromycin (ZITHROMAX) 250 MG tablet Take first 2 tablets together, then 1 every day until finished. 01/10/21   Particia Nearing, PA-C  cefPROZIL (CEFZIL) 500 MG tablet Take 1 tablet (500 mg total) by mouth 2 (two) times daily. 03/06/19   Merlyn Albert, MD  fluticasone-salmeterol (ADVAIR DISKUS) 250-50 MCG/ACT AEPB Inhale 1 puff into the lungs in the morning and at bedtime. 01/10/21   Particia Nearing, PA-C  fluticasone-salmeterol (ADVAIR [facility]) [phone] MCG/ACT inhaler Inhale 2 puffs twice a day 03/06/19   Merlyn Albert, MD  HYDROcodone-acetaminophen  (NORCO/VICODIN) 5-325 MG tablet Take 1 tablet by mouth every 4 (four) hours as needed. 02/28/18   Burgess Amor, PA-C  naproxen (NAPROSYN) 500 MG tablet Take 1 tablet (500 mg total) by mouth 2 (two) times daily. 02/28/18   Burgess Amor, PA-C  predniSONE (DELTASONE) 20 MG tablet 3qd for 2d then 2qd for 2d then 1qd for 2d 03/06/19   Merlyn Albert, MD  predniSONE (DELTASONE) 20 MG tablet Take 2 tablets (40 mg total) by mouth daily with breakfast. 01/10/21   Particia Nearing, PA-C     Critical care time: 45 min independent of procedures

## 2022-10-29 NOTE — ED Notes (Signed)
Dr Estell Harpin at bedside. Daughter at bedside. Anette Riedel, [facility] Courtney, RN Morrie Sheldon, RN primary nurse, charlotte RN charge nurse, [employer] RN and Cala Bradford, RT present in room

## 2022-10-29 NOTE — Procedures (Signed)
Cardiopulmonary Resuscitation Note  Shawn Fisher  [id number]  1961-01-08  Date:11/03/2022  Time:11:54 PM   Provider Performing:Phynix Horton Erby Pian   Procedure: Cardiopulmonary Resuscitation [phone])  Indication(s) Loss of Pulse  Consent N/A  Anesthesia N/A   Time Out N/A   Sterile Technique Hand hygiene, gloves   Procedure Description Called to patient's room for CODE BLUE. Initial rhythm was PEA/Asystole. Patient received high quality chest compressions for 2 minutes with defibrillation or cardioversion when appropriate. Epinephrine was administered every 3 minutes as directed by time Biomedical engineer. Additional procedural interventions include bedside echo.  Return of spontaneous circulation was achieved.  Family to be notified.   Complications/Tolerance N/A   EBL N/A   Specimen(s) N/A  Estimated time to ROSC: 2 minutes

## 2022-10-29 NOTE — Sepsis Progress Note (Signed)
Elink monitoring for the code sepsis protocol.  

## 2022-10-29 NOTE — ED Notes (Signed)
ROSC obtained.

## 2022-10-29 NOTE — ED Triage Notes (Signed)
Pt found down by daughter, down time unknown, pt was altered, yelling and coughing up a "yellow substance" Pt has a hx of copd. Pt had stopped breathing in EMS and EMS was unable to cpap. Pt unresponsive and blue at time of arrival. Faint pulse present at time of arrival

## 2022-10-29 NOTE — ED Notes (Signed)
HR dropped to 30 bpm- Epi push given

## 2022-10-29 NOTE — Progress Notes (Signed)
10/27/2022 PCCM Note   Called in, looks like [facility] pulmonary driven BP okay for now but on fair dose of pressors Grim prognosis but we can take to [facility] and see if we can stabilize. There is chance of recurrent arrest in transport or if he stays at [facility]. Would push bicarb, try to maximize rate on vent. For some reason mic isn't working.  Will relay via securechat.  Myrla Halsted MD PCCM

## 2022-10-29 NOTE — ED Notes (Signed)
Lost pulse- compressions restarted-

## 2022-10-29 NOTE — ED Notes (Signed)
Charge nurse requested epi drip from Dr Estell Harpin- no new orders received.

## 2022-10-29 NOTE — ED Notes (Signed)
Pt daughter here, taken to family room, Dr Estell Harpin aware.

## 2022-10-29 NOTE — Progress Notes (Addendum)
eLink Physician-Brief Progress Note Patient Name: [name] DOB: [birth year] MRN: [id number]   Date of Service  10/19/2022  HPI/Events of Note  62 year old male with a history of COPD, essential hypertension, and chronic pain who presented to the emergency department unresponsive and had a in-hospital V-fib and PEA cardiac arrest and eventually achieved ROSC.  Patient was stabilized and transferred to [facility] for further management.  On presentation, the patient is in severe mixed metabolic and respiratory acidosis, hypoxic despite 100% FiO2 on the ventilator, hypotensive requiring norepinephrine infusion.   Initial metabolic panel consistent with anion gap metabolic acidosis, transaminitis, and CBC consistent with leukocytosis.  INR elevated.  Chest radiograph consistent with COPD, ET tube in appropriate positioning.  Peripheral access only.  eICU Interventions  Scheduled antibiotics  Scheduled SVNs and steroids.  CT PE pending.  Add on UDS  Trend LFTs/INR.  DVT prophylaxis held, although patient has INR elevation of unclear significance. GI prophylaxis with famotidine.   2349 - brief cardiac arrest, ROSC achieved.  Ground team is in the room.  0117 -had another brief cardiac arrest.  ROSC achieved after immediate CPR.  0403 -increase bicarb drip to 200 cc/h.  Tolerating a full 8 cc/kg with a tidal volume of 580.  Still a rate of 34.  No obvious hide hyper inflation.  29 - trop 573, recheck cmp and trop at noon  Intervention Category Evaluation Type: New Patient Evaluation  Shep Porter 10/07/2022, 11:29 PM

## 2022-10-30 ENCOUNTER — Inpatient Hospital Stay (HOSPITAL_COMMUNITY): Payer: 59

## 2022-10-30 ENCOUNTER — Encounter: Payer: Self-pay | Admitting: Pulmonary Disease

## 2022-10-30 DIAGNOSIS — R4182 Altered mental status, unspecified: Secondary | ICD-10-CM

## 2022-10-30 DIAGNOSIS — J432 Centrilobular emphysema: Secondary | ICD-10-CM | POA: Diagnosis not present

## 2022-10-30 DIAGNOSIS — R569 Unspecified convulsions: Secondary | ICD-10-CM | POA: Diagnosis not present

## 2022-10-30 DIAGNOSIS — R092 Respiratory arrest: Secondary | ICD-10-CM | POA: Diagnosis not present

## 2022-10-30 DIAGNOSIS — Z4682 Encounter for fitting and adjustment of non-vascular catheter: Secondary | ICD-10-CM | POA: Diagnosis not present

## 2022-10-30 DIAGNOSIS — R0902 Hypoxemia: Secondary | ICD-10-CM | POA: Diagnosis not present

## 2022-10-30 DIAGNOSIS — R0989 Other specified symptoms and signs involving the circulatory and respiratory systems: Secondary | ICD-10-CM | POA: Diagnosis not present

## 2022-10-30 DIAGNOSIS — S2243XA Multiple fractures of ribs, bilateral, initial encounter for closed fracture: Secondary | ICD-10-CM | POA: Diagnosis not present

## 2022-10-30 DIAGNOSIS — S0003XA Contusion of scalp, initial encounter: Secondary | ICD-10-CM | POA: Diagnosis not present

## 2022-10-30 DIAGNOSIS — R29818 Other symptoms and signs involving the nervous system: Secondary | ICD-10-CM | POA: Diagnosis not present

## 2022-10-30 DIAGNOSIS — R918 Other nonspecific abnormal finding of lung field: Secondary | ICD-10-CM | POA: Diagnosis not present

## 2022-10-30 DIAGNOSIS — G936 Cerebral edema: Secondary | ICD-10-CM | POA: Diagnosis not present

## 2022-10-30 DIAGNOSIS — I2510 Atherosclerotic heart disease of native coronary artery without angina pectoris: Secondary | ICD-10-CM | POA: Diagnosis not present

## 2022-10-30 LAB — COMPREHENSIVE METABOLIC PANEL
ALT: 68 U/L — ABNORMAL HIGH (ref 0–44)
AST: 216 U/L — ABNORMAL HIGH (ref 15–41)
Albumin: 1.7 g/dL — ABNORMAL LOW (ref 3.5–5.0)
Alkaline Phosphatase: 96 U/L (ref 38–126)
Anion gap: 11 (ref 5–15)
BUN: 23 mg/dL (ref 8–23)
CO2: 22 mmol/L (ref 22–32)
Calcium: 8.8 mg/dL — ABNORMAL LOW (ref 8.9–10.3)
Chloride: 107 mmol/L (ref 98–111)
Creatinine, Ser: 1.66 mg/dL — ABNORMAL HIGH (ref 0.61–1.24)
GFR, Estimated: 46 mL/min — ABNORMAL LOW (ref >60)
Glucose, Bld: 154 mg/dL — ABNORMAL HIGH (ref 70–99)
Potassium: 3.7 mmol/L (ref 3.5–5.1)
Sodium: 140 mmol/L (ref 135–145)
Total Bilirubin: 1.5 mg/dL — ABNORMAL HIGH (ref 0.3–1.2)
Total Protein: 4.2 g/dL — ABNORMAL LOW (ref 6.5–8.1)

## 2022-10-30 LAB — POCT I-STAT 7, (LYTES, BLD GAS, ICA,H+H)
Acid-base deficit: 14 mmol/L — ABNORMAL HIGH (ref 0.0–2.0)
Acid-base deficit: 8 mmol/L — ABNORMAL HIGH (ref 0.0–2.0)
Acid-base deficit: 8 mmol/L — ABNORMAL HIGH (ref 0.0–2.0)
Bicarbonate: 16.3 mmol/L — ABNORMAL LOW (ref 20.0–28.0)
Bicarbonate: 21.6 mmol/L (ref 20.0–28.0)
Bicarbonate: 22 mmol/L (ref 20.0–28.0)
Calcium, Ion: 0.89 mmol/L — CL (ref 1.15–1.40)
Calcium, Ion: 1.39 mmol/L (ref 1.15–1.40)
Calcium, Ion: 1.46 mmol/L — ABNORMAL HIGH (ref 1.15–1.40)
HCT: 51 % (ref 39.0–52.0)
HCT: 52 % (ref 39.0–52.0)
HCT: 53 % — ABNORMAL HIGH (ref 39.0–52.0)
Hemoglobin: 17.3 g/dL — ABNORMAL HIGH (ref 13.0–17.0)
Hemoglobin: 17.7 g/dL — ABNORMAL HIGH (ref 13.0–17.0)
Hemoglobin: 18 g/dL — ABNORMAL HIGH (ref 13.0–17.0)
O2 Saturation: 88 %
O2 Saturation: 89 %
O2 Saturation: 89 %
Patient temperature: 36.1
Patient temperature: 36.4
Patient temperature: 36.9
Potassium: 3.5 mmol/L (ref 3.5–5.1)
Potassium: 3.5 mmol/L (ref 3.5–5.1)
Potassium: 3.9 mmol/L (ref 3.5–5.1)
Sodium: 145 mmol/L (ref 135–145)
Sodium: 146 mmol/L — ABNORMAL HIGH (ref 135–145)
Sodium: 146 mmol/L — ABNORMAL HIGH (ref 135–145)
TCO2: 18 mmol/L — ABNORMAL LOW (ref 22–32)
TCO2: 23 mmol/L (ref 22–32)
TCO2: 24 mmol/L (ref 22–32)
pCO2 arterial: 51 mmHg — ABNORMAL HIGH (ref 32–48)
pCO2 arterial: 60.1 mmHg — ABNORMAL HIGH (ref 32–48)
pCO2 arterial: 60.7 mmHg — ABNORMAL HIGH (ref 32–48)
pH, Arterial: 7.108 — CL (ref 7.35–7.45)
pH, Arterial: 7.163 — CL (ref 7.35–7.45)
pH, Arterial: 7.164 — CL (ref 7.35–7.45)
pO2, Arterial: 68 mmHg — ABNORMAL LOW (ref 83–108)
pO2, Arterial: 73 mmHg — ABNORMAL LOW (ref 83–108)
pO2, Arterial: 73 mmHg — ABNORMAL LOW (ref 83–108)

## 2022-10-30 LAB — LACTIC ACID, PLASMA: Lactic Acid, Venous: 5.2 mmol/L (ref 0.5–1.9)

## 2022-10-30 LAB — PROTIME-INR
INR: 1.7 — ABNORMAL HIGH (ref 0.8–1.2)
Prothrombin Time: 19.9 seconds — ABNORMAL HIGH (ref 11.4–15.2)

## 2022-10-30 LAB — TRIGLYCERIDES: Triglycerides: 102 mg/dL (ref <150)

## 2022-10-30 LAB — GLUCOSE, CAPILLARY: Glucose-Capillary: 172 mg/dL — ABNORMAL HIGH (ref 70–99)

## 2022-10-30 LAB — TSH: TSH: 2.163 u[IU]/mL (ref 0.350–4.500)

## 2022-10-30 MED ORDER — SODIUM CHLORIDE 0.9 % IV SOLN: INTRAVENOUS | Status: DC

## 2022-10-30 MED ORDER — IOHEXOL 350 MG/ML SOLN
75.0000 mL | Freq: Once | INTRAVENOUS | Status: AC | PRN
  Administered 2022-10-30: 75 mL via INTRAVENOUS

## 2022-10-30 MED ORDER — VASOPRESSIN 20 UNITS/100 ML INFUSION FOR SHOCK
INTRAVENOUS | Status: AC
  Filled 2022-10-30: qty 100

## 2022-10-30 MED ORDER — AMIODARONE HCL IN DEXTROSE 360-4.14 MG/200ML-% IV SOLN: 30.0000 mg/h | INTRAVENOUS | Status: DC

## 2022-10-30 MED ORDER — VASOPRESSIN 20 UNITS/100 ML INFUSION FOR SHOCK: 0.0000 [IU]/min | INTRAVENOUS | Status: DC

## 2022-10-30 MED ORDER — GLYCOPYRROLATE 0.2 MG/ML IJ SOLN: 0.2000 mg | INTRAMUSCULAR | Status: DC | PRN

## 2022-10-30 MED ORDER — AMIODARONE HCL IN DEXTROSE 360-4.14 MG/200ML-% IV SOLN: 60.0000 mg/h | INTRAVENOUS | Status: AC

## 2022-10-30 MED ORDER — ACETAMINOPHEN 650 MG RE SUPP: 650.0000 mg | Freq: Four times a day (QID) | RECTAL | Status: DC | PRN

## 2022-10-30 MED ORDER — METOPROLOL TARTRATE 5 MG/5ML IV SOLN: 5.0000 mg | Freq: Once | INTRAVENOUS | Status: AC

## 2022-10-30 MED ORDER — SODIUM BICARBONATE 8.4 % IV SOLN
50.0000 meq | Freq: Once | INTRAVENOUS | Status: AC
  Administered 2022-10-30: 50 meq via INTRAVENOUS

## 2022-10-30 MED ORDER — FENTANYL 2500MCG IN NS 250ML (10MCG/ML) PREMIX INFUSION
0.0000 ug/h | INTRAVENOUS | Status: DC
  Administered 2022-10-30: 50 ug/h via INTRAVENOUS

## 2022-10-30 MED ORDER — POLYVINYL ALCOHOL 1.4 % OP SOLN: 1.0000 [drp] | Freq: Four times a day (QID) | OPHTHALMIC | Status: DC | PRN

## 2022-10-30 MED ORDER — GLYCOPYRROLATE 1 MG PO TABS: 1.0000 mg | ORAL_TABLET | ORAL | Status: DC | PRN

## 2022-10-30 MED ORDER — METOPROLOL TARTRATE 5 MG/5ML IV SOLN
INTRAVENOUS | Status: AC
  Administered 2022-10-30: 5 mg via INTRAVENOUS
  Filled 2022-10-30: qty 5

## 2022-10-30 MED ORDER — CALCIUM CHLORIDE 10 % IV SOLN
1.0000 g | Freq: Once | INTRAVENOUS | Status: AC
  Administered 2022-10-30: 1 g via INTRAVENOUS

## 2022-10-30 MED ORDER — AMIODARONE HCL IN DEXTROSE 360-4.14 MG/200ML-% IV SOLN
INTRAVENOUS | Status: AC
  Administered 2022-10-30: 60 mg/h via INTRAVENOUS
  Filled 2022-10-30: qty 200

## 2022-10-30 MED ORDER — FENTANYL BOLUS VIA INFUSION: 100.0000 ug | INTRAVENOUS | Status: DC | PRN

## 2022-10-30 MED ORDER — SODIUM CHLORIDE 0.9 % IV SOLN
4.0000 g | Freq: Once | INTRAVENOUS | Status: AC
  Administered 2022-10-30: 4 g via INTRAVENOUS
  Filled 2022-10-30: qty 40

## 2022-10-30 MED ORDER — ACETAMINOPHEN 325 MG PO TABS: 650.0000 mg | ORAL_TABLET | Freq: Four times a day (QID) | ORAL | Status: DC | PRN

## 2022-11-05 NOTE — Procedures (Signed)
Cardiopulmonary Resuscitation Note  Shawn Fisher  [id number]  1960-09-07  Date:10/15/2022  Time:1:44 AM   Provider Performing:Valetta Mulroy Judie Petit Pecola Leisure   Procedure: Cardiopulmonary Resuscitation [id number]  Indication(s) Loss of Pulse  Consent N/A  Anesthesia N/A  Time Out N/A  Sterile Technique Hand hygiene, gloves  Procedure Description I was present in the patient's room for CODE BLUE. At the end of femoral line placement, patient went into Afib with RVR with HR 170s. Metop 5mg  IV x 1 was administered to attempt to break rhythm. Rhythm subsequently deteriorated into PEA. Patient received high quality chest compressions for 7 total minutes (3 minutes initial CODE BLUE, 4 additional minutes during second CODE BLUE) with defibrillation or cardioversion when appropriate. Epinephrine was administered every 3 minutes as directed by time Biomedical engineer. Additional pharmacologic interventions included calcium chloride and sodium bicarbonate. Patient was already intubated and A-line and femoral line in place. Return of spontaneous circulation was achieved. Patient then appeared to be in SVT with regular rhythm, HR 160s. Synchronized cardioversion was completed x 1. Amiodarone without bolus and vasopressin were initiated.  Family to be notified.   Complications/Tolerance N/A   EBL N/A   Specimen(s) N/A  Estimated time to ROSC: 7 minutes

## 2022-11-05 NOTE — Procedures (Signed)
Central Venous Catheter Insertion Procedure Note  INFANTOF BOZZI  [id number]  1960/10/22  Date:10/05/2022  Time:1:42 AM   Provider Performing:Keandria Berrocal Judie Petit Pecola Leisure   Procedure: Insertion of Non-tunneled Central Venous Catheter(36556) with US guidance ([id number])   Indication(s) Medication administration and Difficult access  Consent Unable to obtain consent due to emergent nature of procedure.  Anesthesia Topical only with 1% lidocaine   Timeout Verified patient identification, verified procedure, site/side was marked, verified correct patient position, special equipment/implants available, medications/allergies/relevant history reviewed, required imaging and test results available.  Sterile Technique Maximal sterile technique including full sterile barrier drape, hand hygiene, sterile gown, sterile gloves, mask, hair covering, sterile ultrasound probe cover (if used).  Procedure Description Area of catheter insertion was cleaned with chlorhexidine and draped in sterile fashion.  With real-time ultrasound guidance a central venous catheter was placed into the right femoral vein. Nonpulsatile blood flow and easy flushing noted in all ports.  The catheter was sutured in place and sterile dressing applied.  Complications/Tolerance None; patient tolerated the procedure well. Left internal jugular CVC was attempted prior to R femoral CVC placement, therefore CXR was ordered to ensure no pneumothorax after attempted placement.  EBL Minimal  Specimen(s) None  Tim Lair, [place] [facility] 11/03/2022 1:43 AM  Please see [email or web address] for pager details.  From 7A-7P if no response, please call [phone] After hours, please call ELink [phone]

## 2022-11-05 NOTE — Progress Notes (Signed)
Pt expired at  1015. Pronounced by 2 RN Savi Lastinger P and Leland Johns.

## 2022-11-05 NOTE — Procedures (Signed)
Extubation Procedure Note  Patient Details:   Name: TRUBY SHEU DOB: [birth year] MRN: [id number]   Airway Documentation:  Airway 8 mm (Active)  Secured at (cm) 24 cm 11/02/2022 0402  Measured From Teeth 10/05/2022 0402  Secured Location Center 11/03/2022 0402  Secured By [employer] 10/22/2022 0402  Tube Holder Repositioned Yes 10/22/2022 1948  Prone position No 10/18/2022 0402  Head position Right 11/03/2022 1948  Cuff Pressure (cm H2O) Green OR 18-26 CmH2O 10/14/2022 0402  Site Condition Dry 10/08/2022 0402   Vent end date: 10/12/2022 Vent end time: 1000   Evaluation  O2 sats: currently acceptable Complications: No apparent complications Patient did tolerate procedure well. Bilateral Breath Sounds: Diminished, Rhonchi   No  Terminal extubation  Kaushal Vannice 10/19/2022, 10:02 AM

## 2022-11-05 NOTE — Procedures (Signed)
Arterial Catheter Insertion Procedure Note  Shawn Fisher  [id number]  [birth year]  Date:10/05/2022  Time:1:41 AM    Provider Performing: Tim Lair    Procedure: Insertion of Arterial Line (84166) with US guidance (06301)   Indication(s) Blood pressure monitoring and/or need for frequent ABGs  Consent Unable to obtain consent due to emergent nature of procedure.  Anesthesia None   Time Out Verified patient identification, verified procedure, site/side was marked, verified correct patient position, special equipment/implants available, medications/allergies/relevant history reviewed, required imaging and test results available.   Sterile Technique Maximal sterile technique including full sterile barrier drape, hand hygiene, sterile gown, sterile gloves, mask, hair covering, sterile ultrasound probe cover (if used).   Procedure Description Area of catheter insertion was cleaned with chlorhexidine and draped in sterile fashion. With real-time ultrasound guidance an arterial catheter was placed into the left radial artery.  Appropriate arterial tracings confirmed on monitor.     Complications/Tolerance None; patient tolerated the procedure well.   EBL Minimal   Specimen(s) None  Tim Lair, PA-C [facility] 10/09/2022 1:41 AM  Please see [email or web address] for pager details.  From 7A-7P if no response, please call [phone] After hours, please call ELink [phone]

## 2022-11-05 NOTE — Progress Notes (Signed)
Nutrition Brief Note ° °Chart reviewed. °Pt now transitioning to comfort care.  °No nutrition interventions planned at this time.  °Please consult as needed.  ° °Kate Holmes, MS, RD, LDN °Inpatient Clinical Dietitian °Please see AMiON for contact information. ° °

## 2022-11-05 NOTE — IPAL (Signed)
  Interdisciplinary Goals of Care Family Meeting   Date carried out: 10/11/2022  Location of the meeting: Unit  Member's involved: Physician, Bedside Registered Nurse, and Family Member or next of kin  Durable Power of Attorney or acting medical decision maker: mother    Discussion: We discussed goals of care for Shawn Fisher .   Discussed his fierce independence and aversion to medical care at baseline. 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 Based on his previous wishes and values he would want a natural passing. Family being called in and we will allow him to go peacefully.  Code status:   Code Status: DNR   Disposition: In-patient comfort care  Time spent for the meeting: 8 mins    Lorin Glass, MD  10/07/2022, 5:46 AM

## 2022-11-05 NOTE — Progress Notes (Signed)
Pt transported to CT and back to 3[facility] via vent without incident

## 2022-11-05 NOTE — Procedures (Signed)
Patient Name: Shawn Fisher  MRN: [id number]  Epilepsy Attending: Charlsie Quest  Referring Physician/Provider: Lorin Glass, MD  Date: 10/26/2022 Duration: 23.08 mins  Patient history: 62yo M s/p cardiac arrest getting eeg to evaluate for seizure  Level of alertness: comatose  AEDs during EEG study: None  Technical aspects: This EEG study was done with scalp electrodes positioned according to the 10-20 International system of electrode placement. Electrical activity was reviewed with band pass filter of 1-70Hz , sensitivity of 7 uV/mm, display speed of 44mm/sec with a 60Hz  notched filter applied as appropriate. EEG data were recorded continuously and digitally stored.  Video monitoring was available and reviewed as appropriate.  Description: EEG showed continuous generalized background suppression, not reactive to stimulation. Hyperventilation and photic stimulation were not performed.     ABNORMALITY - Background suppression, generalized  IMPRESSION: This study is suggestive of profound diffuse encephalopathy, nonspecific etiology. No seizures or epileptiform discharges were seen throughout the recording.  Cheridan Kibler Annabelle Harman

## 2022-11-05 NOTE — Progress Notes (Signed)
EEG complete - results pending 

## 2022-11-05 NOTE — Progress Notes (Signed)
Wasted 230 ml of fentanyl with 2 RN

## 2022-11-05 NOTE — Progress Notes (Addendum)
Family note  Met with family to get more hx. 3-4 weeks of increasing DOE and LE swelling Too stubborn to go to doctor (lifelong issue) Longstanding smoker and ongoing MMRC 1 before this all started now [facility] No clear ischemic symptoms otherwise ~16 years ago was told he had a minor stroke and irregular heartbeat, did not pursue further workup Confusion started sometime yesterday Explained usual workup and prognosticating for cardiac arrests and how we are on very critical hours where recurrent arrest possible. Would like everything done at present. Consent obtained for central line arterial line and everything needed to preserve life.  Myrla Halsted MD PCCM

## 2022-11-05 NOTE — Progress Notes (Signed)
Patient is not available at moment per nurse , will try back as schedule allows.

## 2022-11-05 NOTE — Progress Notes (Signed)
Pharmacy Antibiotic Note  Shawn Fisher is a 62 y.o. male admitted on 10/26/2022 with  s/p cardiac arrest .  Pharmacy has been consulted for Zosyn dosing for aspiration pneumonia. WBC 18.4. Renal function ok.   Plan: Zosyn 3.375G IV q8h to be infused over 4 hours  Height: 5\' 10"  (177.8 cm) Weight: 74.8 kg (165 lb) IBW/kg (Calculated) : 73  Temp (24hrs), Avg:96.2 F (35.7 C), Min:95.8 F (35.4 C), Max:96.4 F (35.8 C)  Recent Labs  Lab 10/31/2022 1856 10/16/2022 2100  WBC 18.4*  --   CREATININE 1.04  --   LATICACIDVEN  --  8.4*    Estimated Creatinine Clearance: 74.1 mL/min (by C-G formula based on SCr of 1.04 mg/dL).    No Known Allergies  Abran Duke, PharmD, BCPS Clinical Pharmacist Phone: [phone]

## 2022-11-05 DEATH — deceased

## 2022-12-06 NOTE — Death Summary Note (Signed)
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  As EMS was transferring him, went into cardiac arrest, King airway placed.  Airway exchanged in ER, and he arrested again.  Unclear total duration of CPR prior to ROSC.    Started on pressors, PCCM to admit to [facility] from [facility].   Very unstable on arrival with multiple cardiac arrests, eventually stabilized enough for head CT which revealed large completed R MCA stroke.  Met with family to discuss dismal prognosis and patient values.  They decided to allow patient to pass peacefully.   Pertinent Labs and Studies  Significant Diagnostic Studies EEG adult  Result Date: 11/05/22 Charlsie Quest, MD     Nov 05, 2022  8:46 AM Patient Name: Shawn Fisher MRN: [id number] Epilepsy Attending: Charlsie Quest Referring Physician/Provider: Lorin Glass, MD Date: November 05, 2022 Duration: 23.08 mins Patient history: 62yo M s/p cardiac arrest getting eeg to evaluate for seizure Level of alertness: comatose AEDs during EEG study: None Technical aspects: This EEG study was done with scalp electrodes positioned according to the 10-20 International system of electrode placement. Electrical activity was reviewed with band pass filter of 1-70Hz , sensitivity of 7 uV/mm, display speed of 13mm/sec with a  60Hz  notched filter applied as appropriate. EEG data were recorded continuously and digitally stored.  Video monitoring was available and reviewed as appropriate. Description: EEG showed continuous generalized background suppression, not reactive to stimulation. Hyperventilation and photic stimulation were not performed.   ABNORMALITY - Background suppression, generalized IMPRESSION: This study is suggestive of profound diffuse encephalopathy, nonspecific etiology. No seizures or epileptiform discharges were seen throughout the recording. Charlsie Quest   CT Angio Chest Pulmonary Embolism (PE) W or WO Contrast  Addendum Date: 2022/11/05   ADDENDUM REPORT: November 05, 2022 05:33 ADDENDUM: Study discussed by telephone with Cloyd Stagers, Critical Care PA on 11/05/22 at 0522 hours. Electronically Signed   By: Odessa Fleming M.D.   On: 11-05-2022 05:33   Result Date: November 05, 2022 CLINICAL DATA:  62 year old male neurologic deficit. Found by family poorly responsive. Cardiac arrest with EMS and again in the ER. Intubated. EXAM: CT ANGIOGRAPHY CHEST WITH CONTRAST TECHNIQUE: Multidetector CT imaging of the chest was performed using the standard protocol during bolus administration of intravenous contrast. Multiplanar CT image reconstructions and MIPs were obtained to evaluate the vascular anatomy. RADIATION DOSE REDUCTION: This exam was performed according to the departmental dose-optimization program which includes automated exposure control, adjustment of the mA and/or kV according to patient size and/or use of iterative reconstruction technique. CONTRAST:  75mL OMNIPAQUE IOHEXOL 350 MG/ML SOLN COMPARISON:  Portable chest 0149 hours today. FINDINGS: Cardiovascular: Good contrast bolus timing in the pulmonary arterial tree. No pulmonary artery filling defect is identified. No contrast in the aorta. Calcified aortic atherosclerosis. Calcified coronary artery plaque is evident on series 8, image 242. Cardiac size remains  within  normal limits. No pericardial effusion. There is prominent contrast reflux from the right heart into the IVC, hepatic veins, and some of this contrast is layering dependently within the liver and IVC. Mediastinum/Nodes: Enteric tube courses through the esophagus to the stomach. No mediastinal hematoma, mass, or lymphadenopathy identified. Lungs/Pleura: Intubated. Endotracheal tube tip in good position above the carina. Saber sheath trachea. Extensive bilateral dependent upper lobe and lower lobe pulmonary consolidation, slightly greater in the left lung. Superimposed apical pulmonary septal thickening. Some underlying centrilobular emphysema is evident. No pneumothorax.  No significant pleural effusion. Upper Abdomen: Refluxed and dependent IV contrast in the liver and IVC. Otherwise negative visible noncontrast appearance of the liver, spleen, pancreas. Mild adrenal gland thickening such as due to adrenal hyperplasia. Nonspecific left pararenal space soft tissue stranding on series 6, image 175. The left kidney does not appear hydronephrotic but is incompletely visible. Enteric tube terminates in the distal stomach. Negative visible bowel. No upper abdominal free air or free fluid. Musculoskeletal: Bilateral rib fractures, probably CPR related. Bilateral posterior 1st rib fractures, nondisplaced on the left and mildly displaced on the right. The right 3rd and 4th ribs are fractured in 2 places each. Otherwise nondisplaced bilateral 2 through 6/7 rib fractures are noted. No sternal fracture identified. Visible shoulder osseous structures appear intact. No thoracic vertebral fracture identified. Review of the MIP images confirms the above findings. IMPRESSION: 1. No pulmonary embolus identified. Reflux of contrast from the right heart into the dependent liver and IVC suggests some decreased cardiac output. 2. Extensive bilateral dependent lung consolidation in the bilateral upper and lower lobes. Favor large volume  aspiration in this setting. No pneumothorax or pleural effusion. Some underlying Emphysema (ICD10-J43.9). 3. Satisfactory endotracheal and enteric tubes. 4. CPR related bilateral rib fractures 1 through 6/7. Right 3rd and 4th ribs are each fractured in two places. 5. Nonspecific left pararenal space soft tissue stranding. Left kidney does not appear hydronephrotic but is incompletely visible. CT Abdomen and Pelvis (IV contrast preferred) might characterize further. 6.  Aortic Atherosclerosis (ICD10-I70.0). Electronically Signed: By: Odessa Fleming M.D. On: 10/20/2022 05:17   CT HEAD WO CONTRAST ( )  Addendum Date: 10/24/2022   ADDENDUM REPORT: 10/31/2022 05:32 ADDENDUM: Study discussed by telephone with Cloyd Stagers, Critical Care PA on 10/10/2022 at 0522 hours. Electronically Signed   By: Odessa Fleming M.D.   On: 10/21/2022 05:32   Result Date: 10/29/2022 CLINICAL DATA:  62 year old male neurologic deficit. Found by family poorly responsive. Cardiac arrest with EMS and again in the ER. Intubated. EXAM: CT HEAD WITHOUT CONTRAST TECHNIQUE: Contiguous axial images were obtained from the base of the skull through the vertex without intravenous contrast. RADIATION DOSE REDUCTION: This exam was performed according to the departmental dose-optimization program which includes automated exposure control, adjustment of the mA and/or kV according to patient size and/or use of iterative reconstruction technique. COMPARISON:  None Available. FINDINGS: Brain: Large right hemisphere infarct with cytotoxic edema. Edema appears limited to the right MCA territory. ASPECTS is 2. And there is evidence of patchy areas of petechial hemorrhagic transformation within the affected parenchyma (medial right parietal lobe series 5, image 22, right temporal lobe series 6, image 33. But no malignant hemorrhagic transformation at this time. Mass effect on the right lateral ventricle, but no midline shift at this time. Basilar cisterns remain  patent. No ventriculomegaly. Outside of the right MCA territory gray-white differentiation is maintained. Vascular: Calcified atherosclerosis at the skull base. And suspicious hyperdensity of the right  MCA bifurcation (series 7, image 20). Skull: No acute osseous abnormality identified. Sinuses/Orbits: Maxillary and sphenoid sinus opacification in the setting of intubation. Tympanic cavities and mastoids remain clear. Other: Broad-based posterior scalp hematoma, up to 12 mm in thickness. No scalp soft tissue gas. Disconjugate gaze but otherwise orbits soft tissues appear negative. Endotracheal and oral enteric tubes partially visible. IMPRESSION: 1. Evidence of Right MCA LVO with a large acute or subacute MCA territory infarct (ASPECTS 2). 2. Multifocal petechial hemorrhage, but no malignant hemorrhagic transformation at this time. Mass effect on the right lateral ventricle, but no midline shift at this time. 3. Broad-based posterior scalp hematoma without underlying skull fracture. 4. Intubated. Electronically Signed: By: Odessa Fleming M.D. On: 10/25/2022 05:08   DG Chest Port 1 View  Result Date: 10/17/2022 CLINICAL DATA:  Hypoxia EXAM: PORTABLE CHEST 1 VIEW COMPARISON:  10/23/2022 FINDINGS: Cardiac shadow is stable. Vascular congestion is noted with diffuse interstitial changes stable in appearance from the previous day. Endotracheal tube and gastric catheter are noted in satisfactory position. No pneumothorax is seen. No sizable effusion is noted. IMPRESSION: Stable interstitial changes. No pneumothorax is noted. Tubes and lines in satisfactory position. Electronically Signed   By: Alcide Clever M.D.   On: 10/12/2022 02:03   DG Chest Port 1 View  Result Date: 11/02/2022 CLINICAL DATA:  Intubated EXAM: PORTABLE CHEST 1 VIEW COMPARISON:  09/24/2017 FINDINGS: Endotracheal tube 4.5 cm above the carina. NG tube enters the stomach. Heart and mediastinal contours are within normal limits. Peribronchial thickening.  Interstitial prominence most pronounced in the upper lobes. No effusions or acute bony abnormality. IMPRESSION: Endotracheal tube 4.5 cm above the carina. Mild bronchitic changes. Interstitial prominence in the upper lobes, new since prior study. Favor chronic lung disease although atypical infection cannot be completely excluded. Electronically Signed   By: Charlett Nose M.D.   On: 10/28/2022 19:44    Microbiology Recent Results (from the past 240 hour(s))  Urine Culture     Status: None   Collection Time: 10/09/2022  6:56 PM   Specimen: Urine, Random  Result Value Ref Range Status   Specimen Description   Final    URINE, RANDOM Performed at [facility], [address], [place], [place] [address]    Special Requests   Final    NONE Reflexed from [id number] Performed at [facility], [address], [place], [place] [address]    Culture   Final    NO GROWTH Performed at [facility], [address], [place], [place] [address]    Report Status 10/31/2022 FINAL  Final  Resp panel by RT-PCR (RSV, Flu A&B, Covid)     Status: None   Collection Time: 10/18/2022  8:25 PM  Result Value Ref Range Status   SARS Coronavirus 2 by RT PCR NEGATIVE NEGATIVE Final    Comment: (NOTE) SARS-CoV-2 target nucleic acids are NOT DETECTED.  The SARS-CoV-2 RNA is generally detectable in upper respiratory specimens during the acute phase of infection. The lowest concentration of SARS-CoV-2 viral copies this assay can detect is 138 copies/mL. A negative result does not preclude SARS-Cov-2 infection and should not be used as the sole basis for treatment or other patient management decisions. A negative result may occur with  improper specimen collection/handling, submission of specimen other than nasopharyngeal swab, presence of viral mutation(s) within the areas targeted by this assay, and inadequate number of viral copies(<138 copies/mL). A negative result must be combined with clinical observations, patient  history, and  epidemiological information. The expected result is Negative.  Fact Sheet for Patients:  BloggerCourse.com  Fact Sheet for Healthcare Providers:  SeriousBroker.it  This test is no t yet approved or cleared by the [place] FDA and  has been authorized for detection and/or diagnosis of SARS-CoV-2 by FDA under an Emergency Use Authorization (EUA). This EUA will remain  in effect (meaning this test can be used) for the duration of the COVID-19 declaration under Section 564(b)(1) of the Act, 21 U.S.C.section 360bbb-3(b)(1), unless the authorization is terminated  or revoked sooner.       Influenza A by PCR NEGATIVE NEGATIVE Final   Influenza B by PCR NEGATIVE NEGATIVE Final    Comment: (NOTE) The Xpert Xpress SARS-CoV-2/FLU/RSV plus assay is intended as an aid in the diagnosis of influenza from Nasopharyngeal swab specimens and should not be used as a sole basis for treatment. Nasal washings and aspirates are unacceptable for Xpert Xpress SARS-CoV-2/FLU/RSV testing.  Fact Sheet for Patients: BloggerCourse.com  Fact Sheet for Healthcare Providers: SeriousBroker.it  This test is not yet approved or cleared by the [place] FDA and has been authorized for detection and/or diagnosis of SARS-CoV-2 by FDA under an Emergency Use Authorization (EUA). This EUA will remain in effect (meaning this test can be used) for the duration of the COVID-19 declaration under Section 564(b)(1) of the Act, 21 U.S.C. section 360bbb-3(b)(1), unless the authorization is terminated or revoked.     Resp Syncytial Virus by PCR NEGATIVE NEGATIVE Final    Comment: (NOTE) Fact Sheet for Patients: BloggerCourse.com  Fact Sheet for Healthcare Providers: SeriousBroker.it  This test is not yet approved or cleared by the [place] FDA  and has been authorized for detection and/or diagnosis of SARS-CoV-2 by FDA under an Emergency Use Authorization (EUA). This EUA will remain in effect (meaning this test can be used) for the duration of the COVID-19 declaration under Section 564(b)(1) of the Act, 21 U.S.C. section 360bbb-3(b)(1), unless the authorization is terminated or revoked.  Performed at [facility], [address] [place], [place] [address]   Blood Culture (routine x 2)     Status: None   Collection Time: 10/12/2022  9:00 PM   Specimen: BLOOD  Result Value Ref Range Status   Specimen Description BLOOD BLOOD LEFT HAND  Final   Special Requests   Final    BOTTLES DRAWN AEROBIC AND ANAEROBIC Blood Culture adequate volume   Culture   Final    NO GROWTH 5 DAYS Performed at [facility], [address], [place], [place] [address]    Report Status 11/03/2022 FINAL  Final  Blood Culture (routine x 2)     Status: None   Collection Time: 10/21/2022  9:00 PM   Specimen: BLOOD  Result Value Ref Range Status   Specimen Description BLOOD BLOOD RIGHT HAND  Final   Special Requests   Final    BOTTLES DRAWN AEROBIC AND ANAEROBIC Blood Culture adequate volume   Culture   Final    NO GROWTH 5 DAYS Performed at [facility], [address] [place], [place] [address]    Report Status 11/03/2022 FINAL  Final  MRSA Next Gen by PCR, Nasal     Status: None   Collection Time: 10/26/2022 11:45 PM   Specimen: Nasal Mucosa; Nasal Swab  Result Value Ref Range Status   MRSA by PCR Next Gen NOT DETECTED NOT DETECTED Final    Comment: (NOTE) The GeneXpert MRSA Assay (FDA approved for NASAL specimens only), is  one component of a comprehensive MRSA colonization surveillance program. It is not intended to diagnose MRSA infection nor to guide or monitor treatment for MRSA infections. Test performance is not FDA approved in patients less than 46 years old. Performed at [facility], [address], [place], [place] [address]      Lab Basic Metabolic Panel: Recent Labs  Lab 11/03/2022 1856 10/24/2022 0020 10/14/2022 0155 10/14/2022 0306 10/07/2022 0359 10/29/2022 0519  NA 136 146* 146* 142 145 140  K 5.0 3.5 3.5 4.3 3.9 3.7  CL 102  --   --  108  --  107  CO2 18*  --   --  19*  --  22  GLUCOSE 129*  --   --  176*  --  154*  BUN 16  --   --  22  --  23  CREATININE 1.04  --   --  1.67*  --  1.66*  CALCIUM 8.0*  --   --  8.2*  --  8.8*  MG  --   --   --  1.7  --   --    Liver Function Tests: Recent Labs  Lab 10/18/2022 1856 10/22/2022 0306 10/14/2022 0519  AST 56* 235* 216*  ALT 30 70* 68*  ALKPHOS 147* 103 96  BILITOT 3.7* 1.6* 1.5*  PROT 7.2 4.5* 4.2*  ALBUMIN 3.3* 2.0* 1.7*   No results for input(s): "LIPASE", "AMYLASE" in the last 168 hours. No results for input(s): "AMMONIA" in the last 168 hours. CBC: Recent Labs  Lab 10/15/2022 1856 10/22/2022 0020 10/26/2022 0155 10/24/2022 0306 10/17/2022 0359  WBC 18.4*  --   --  18.2*  --   NEUTROABS 14.2*  --   --   --   --   HGB 15.9 17.7* 17.3* 15.9 18.0*  HCT 53.6* 52.0 51.0 54.6* 53.0*  MCV 88.4  --   --  87.9  --   PLT 273  --   --  214  --    Cardiac Enzymes: No results for input(s): "CKTOTAL", "CKMB", "CKMBINDEX", "TROPONINI" in the last 168 hours. Sepsis Labs: Recent Labs  Lab 11/03/2022 1856 10/17/2022 2100 10/22/2022 0306  PROCALCITON  --   --  16.79  WBC 18.4*  --  18.2*  LATICACIDVEN  --  8.4* 5.2*      Lorin Glass 11/05/2022, 5:28 PM

## 2023-02-16 MED FILL — Medication: Qty: 1 | Status: AC
